# Patient Record
Sex: Female | Born: 1970 | Race: White | Hispanic: No | Marital: Married | Smoking: Never smoker
Health system: Southern US, Community
[De-identification: ages and names within clinical notes are randomized; demographics above are authoritative.]

## PROBLEM LIST (undated history)

## (undated) DIAGNOSIS — L709 Acne, unspecified: Secondary | ICD-10-CM

## (undated) DIAGNOSIS — F419 Anxiety disorder, unspecified: Secondary | ICD-10-CM

## (undated) DIAGNOSIS — F32A Depression, unspecified: Secondary | ICD-10-CM

## (undated) DIAGNOSIS — G2581 Restless legs syndrome: Secondary | ICD-10-CM

## (undated) DIAGNOSIS — C50919 Malignant neoplasm of unspecified site of unspecified female breast: Secondary | ICD-10-CM

## (undated) DIAGNOSIS — Z800 Family history of malignant neoplasm of digestive organs: Secondary | ICD-10-CM

## (undated) DIAGNOSIS — Z8052 Family history of malignant neoplasm of bladder: Secondary | ICD-10-CM

## (undated) HISTORY — PX: COLONOSCOPY: SHX174

## (undated) HISTORY — DX: Family history of malignant neoplasm of bladder: Z80.52

## (undated) HISTORY — DX: Family history of malignant neoplasm of digestive organs: Z80.0

## (undated) HISTORY — DX: Malignant neoplasm of unspecified site of unspecified female breast: C50.919

---

## 2003-01-30 ENCOUNTER — Inpatient Hospital Stay (HOSPITAL_COMMUNITY): Admission: AD | Admit: 2003-01-30 | Discharge: 2003-01-30 | Payer: Self-pay | Admitting: Obstetrics and Gynecology

## 2003-01-30 ENCOUNTER — Encounter: Payer: Self-pay | Admitting: Obstetrics and Gynecology

## 2003-03-02 ENCOUNTER — Ambulatory Visit (HOSPITAL_COMMUNITY): Admission: RE | Admit: 2003-03-02 | Discharge: 2003-03-02 | Payer: Self-pay | Admitting: Obstetrics and Gynecology

## 2003-03-02 ENCOUNTER — Encounter: Payer: Self-pay | Admitting: Obstetrics and Gynecology

## 2003-06-12 ENCOUNTER — Inpatient Hospital Stay (HOSPITAL_COMMUNITY): Admission: AD | Admit: 2003-06-12 | Discharge: 2003-06-12 | Payer: Self-pay | Admitting: Obstetrics and Gynecology

## 2003-06-14 ENCOUNTER — Inpatient Hospital Stay (HOSPITAL_COMMUNITY): Admission: AD | Admit: 2003-06-14 | Discharge: 2003-06-14 | Payer: Self-pay | Admitting: Obstetrics and Gynecology

## 2003-07-04 ENCOUNTER — Encounter: Payer: Self-pay | Admitting: Obstetrics and Gynecology

## 2003-07-04 ENCOUNTER — Inpatient Hospital Stay (HOSPITAL_COMMUNITY): Admission: AD | Admit: 2003-07-04 | Discharge: 2003-07-04 | Payer: Self-pay | Admitting: Obstetrics and Gynecology

## 2003-07-06 ENCOUNTER — Inpatient Hospital Stay (HOSPITAL_COMMUNITY): Admission: AD | Admit: 2003-07-06 | Discharge: 2003-07-06 | Payer: Self-pay | Admitting: Obstetrics and Gynecology

## 2003-07-07 ENCOUNTER — Inpatient Hospital Stay (HOSPITAL_COMMUNITY): Admission: AD | Admit: 2003-07-07 | Discharge: 2003-07-09 | Payer: Self-pay | Admitting: Obstetrics and Gynecology

## 2003-08-15 ENCOUNTER — Other Ambulatory Visit: Admission: RE | Admit: 2003-08-15 | Discharge: 2003-08-15 | Payer: Self-pay | Admitting: Obstetrics and Gynecology

## 2004-10-02 ENCOUNTER — Ambulatory Visit: Payer: Self-pay | Admitting: Internal Medicine

## 2004-12-29 ENCOUNTER — Other Ambulatory Visit: Admission: RE | Admit: 2004-12-29 | Discharge: 2004-12-29 | Payer: Self-pay | Admitting: Obstetrics and Gynecology

## 2005-05-04 ENCOUNTER — Ambulatory Visit: Payer: Self-pay | Admitting: Internal Medicine

## 2017-10-20 ENCOUNTER — Other Ambulatory Visit (HOSPITAL_COMMUNITY)
Admission: RE | Admit: 2017-10-20 | Discharge: 2017-10-20 | Disposition: A | Discharge status: Home / Self Care | Payer: BC Managed Care – PPO | Source: Ambulatory Visit | Attending: Family Medicine | Admitting: Family Medicine

## 2017-10-20 ENCOUNTER — Other Ambulatory Visit: Payer: Self-pay | Admitting: Family Medicine

## 2017-10-20 DIAGNOSIS — Z124 Encounter for screening for malignant neoplasm of cervix: Secondary | ICD-10-CM | POA: Insufficient documentation

## 2017-10-22 LAB — CYTOLOGY - PAP
Diagnosis: NEGATIVE
HPV (WINDOPATH): NOT DETECTED

## 2018-04-13 ENCOUNTER — Other Ambulatory Visit: Payer: Self-pay | Admitting: Family Medicine

## 2018-04-13 DIAGNOSIS — Z1231 Encounter for screening mammogram for malignant neoplasm of breast: Secondary | ICD-10-CM

## 2018-05-06 ENCOUNTER — Ambulatory Visit: Payer: BC Managed Care – PPO

## 2018-05-24 ENCOUNTER — Ambulatory Visit: Payer: BC Managed Care – PPO

## 2018-09-26 ENCOUNTER — Ambulatory Visit
Admission: RE | Admit: 2018-09-26 | Discharge: 2018-09-26 | Disposition: A | Discharge status: Home / Self Care | Payer: BC Managed Care – PPO | Source: Ambulatory Visit | Attending: Family Medicine | Admitting: Family Medicine

## 2018-09-26 ENCOUNTER — Ambulatory Visit: Payer: BC Managed Care – PPO

## 2018-09-26 DIAGNOSIS — Z1231 Encounter for screening mammogram for malignant neoplasm of breast: Secondary | ICD-10-CM

## 2018-09-26 IMAGING — MG DIGITAL SCREENING BILATERAL MAMMOGRAM WITH TOMO AND CAD
8 series · 8 of 24 positions shown · non-contrast
Comparison: Previous exam(s).

CLINICAL DATA: Screening.

EXAM:
DIGITAL SCREENING BILATERAL MAMMOGRAM WITH TOMO AND CAD

[L CC synth-2D]
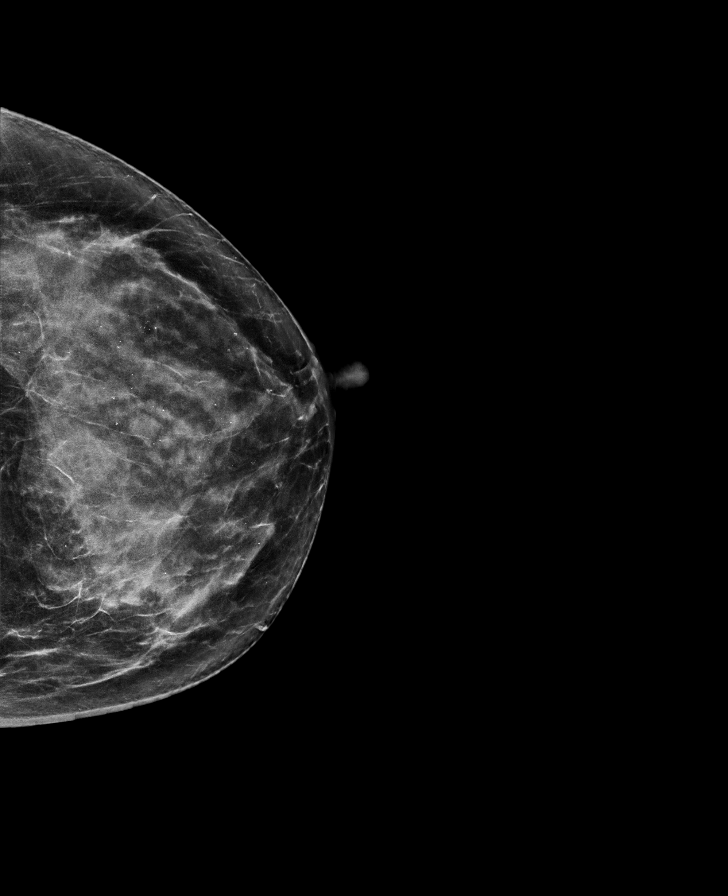

[R MLO synth-2D]
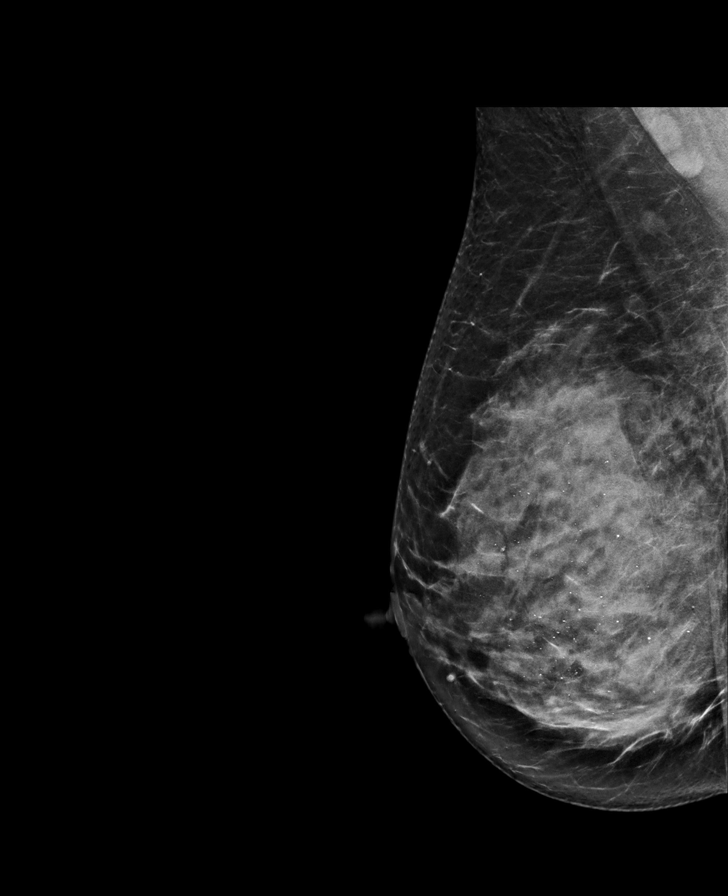

[L MLO synth-2D]
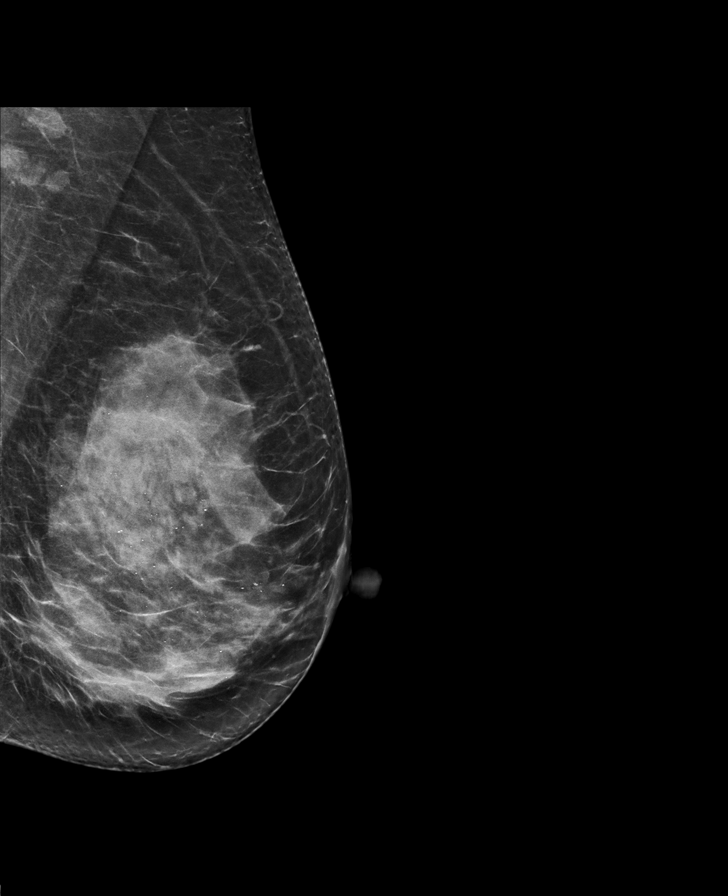

[R CC synth-2D]
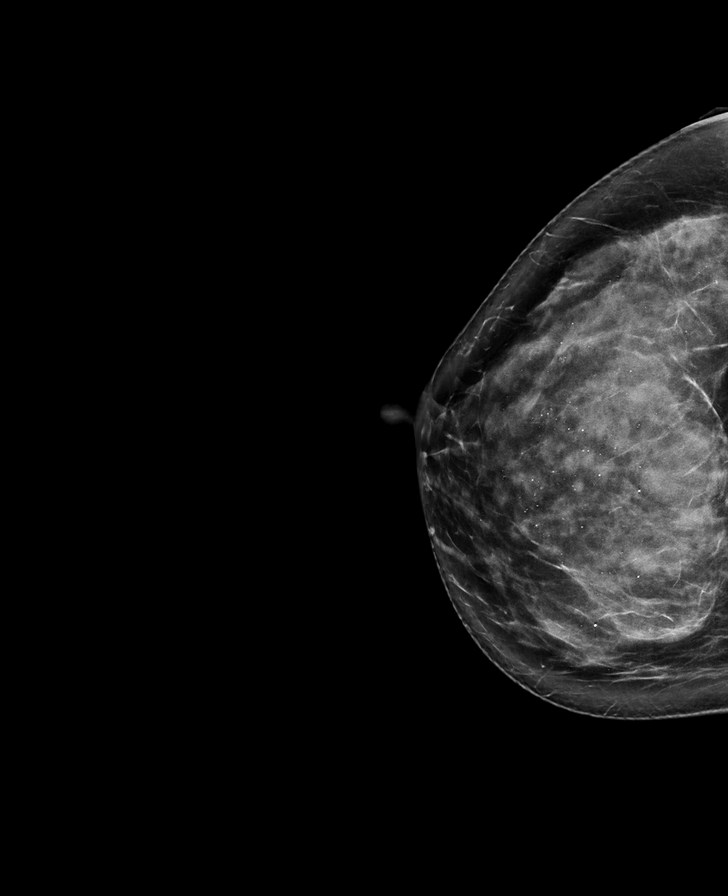

[L CC tomo · tomo slice 36/71.0]
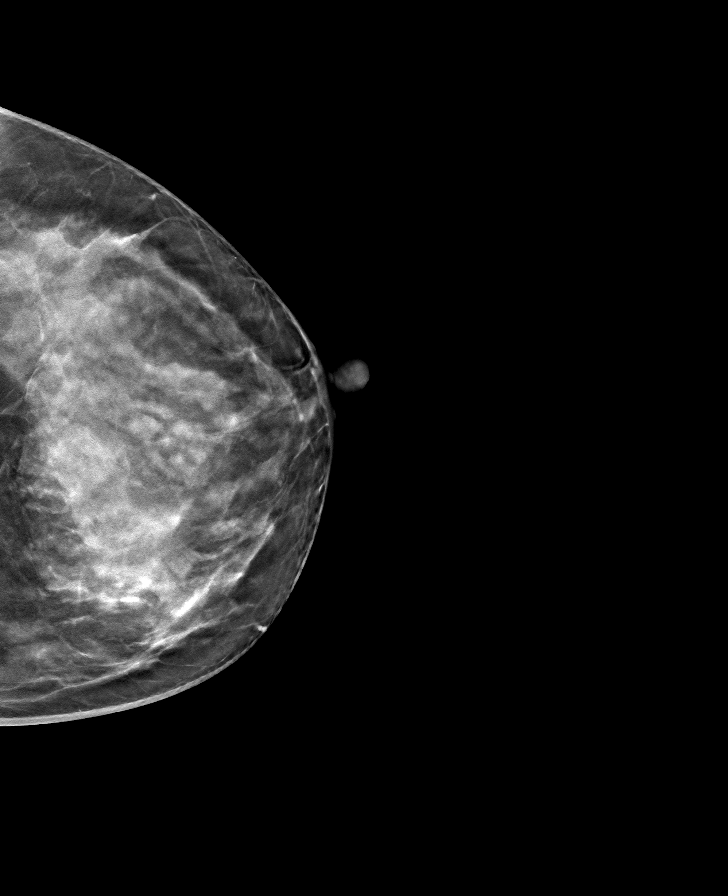

[R MLO tomo · tomo slice 39/76.0]
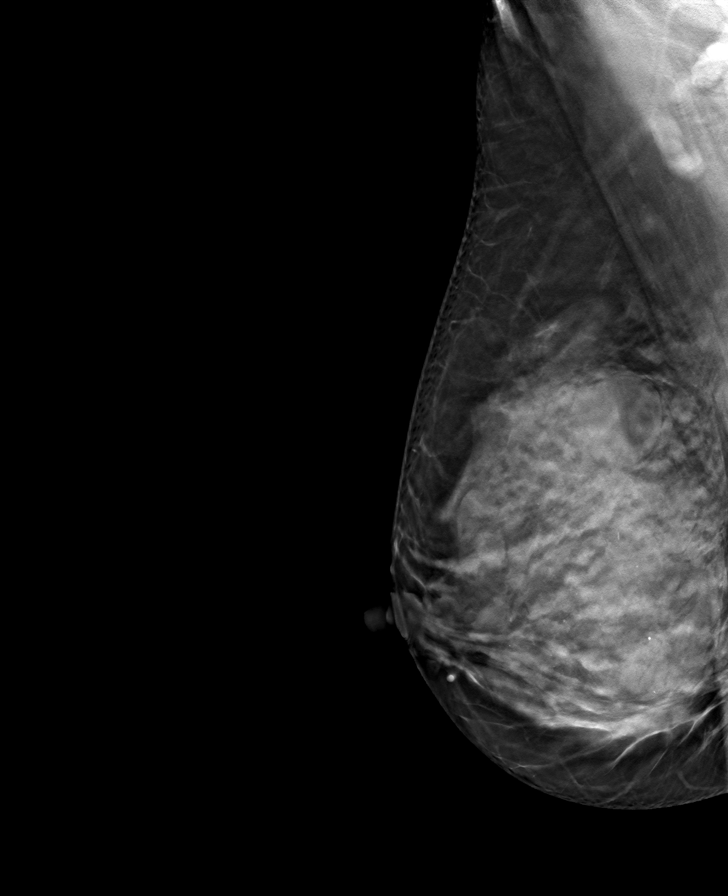

[R CC tomo · tomo slice 38/75.0]
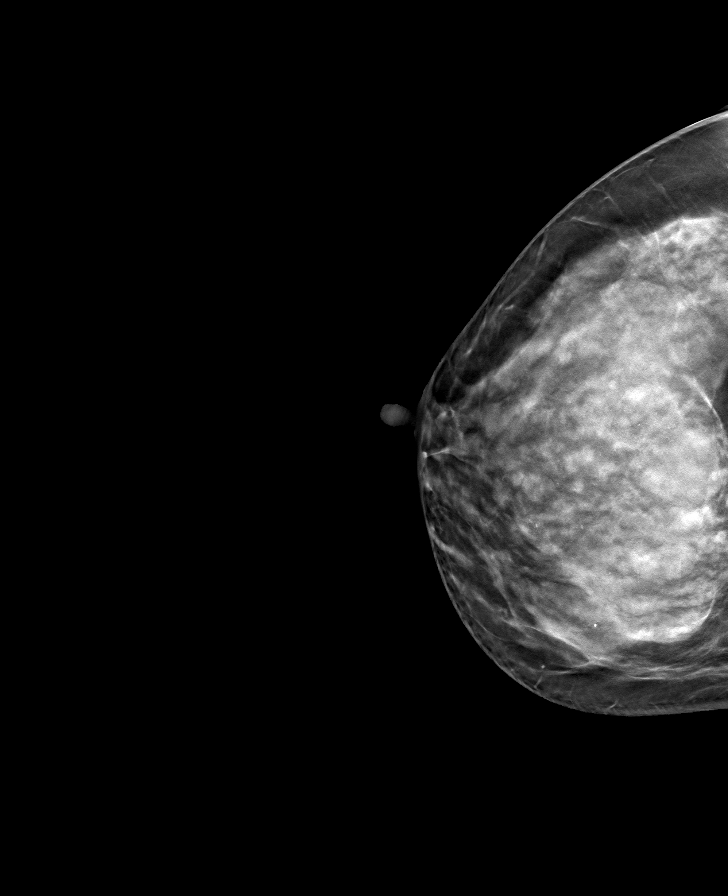

[L MLO tomo · tomo slice 37/73.0]
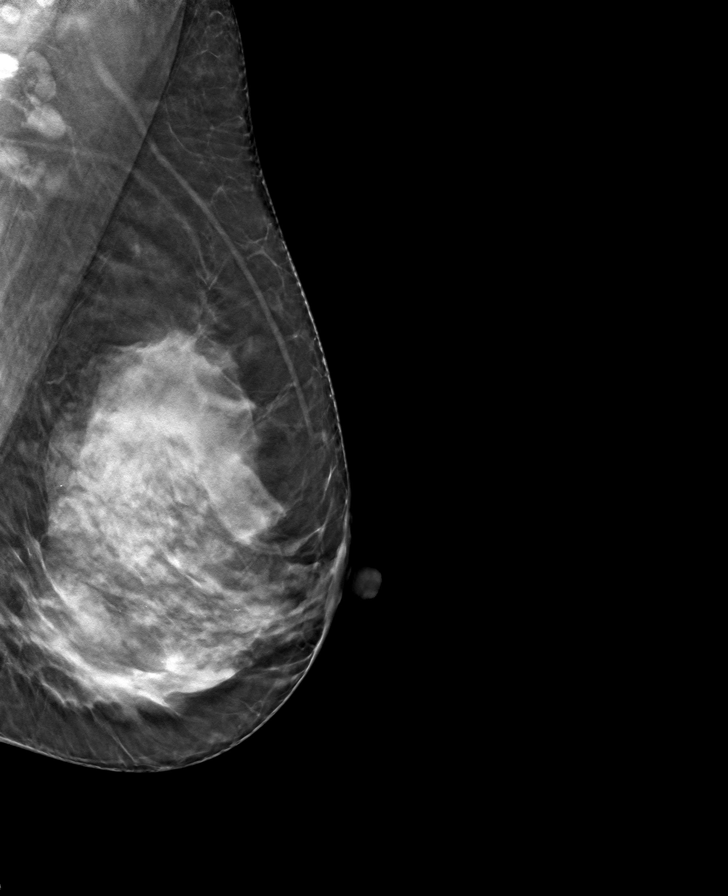

[8 of 24 positions shown; findings below may reference images not displayed]

ACR Breast Density Category d: The breast tissue is extremely dense,
which lowers the sensitivity of mammography
FINDINGS: There are no findings suspicious for malignancy. Images were
processed with CAD.
IMPRESSION: No mammographic evidence of malignancy. A result letter of this
screening mammogram will be mailed directly to the patient.

RECOMMENDATION:
Screening mammogram in one year. (Code:[5I])

BI-RADS CATEGORY  1: Negative.

## 2020-01-31 ENCOUNTER — Other Ambulatory Visit: Payer: Self-pay | Admitting: Family Medicine

## 2020-01-31 DIAGNOSIS — Z1231 Encounter for screening mammogram for malignant neoplasm of breast: Secondary | ICD-10-CM

## 2020-02-23 ENCOUNTER — Other Ambulatory Visit: Payer: Self-pay

## 2020-02-23 ENCOUNTER — Ambulatory Visit
Admission: RE | Admit: 2020-02-23 | Discharge: 2020-02-23 | Disposition: A | Discharge status: Home / Self Care | Payer: BC Managed Care – PPO | Source: Ambulatory Visit | Attending: Family Medicine | Admitting: Family Medicine

## 2020-02-23 ENCOUNTER — Ambulatory Visit: Payer: BC Managed Care – PPO

## 2020-02-23 DIAGNOSIS — Z1231 Encounter for screening mammogram for malignant neoplasm of breast: Secondary | ICD-10-CM

## 2020-02-23 IMAGING — MG DIGITAL SCREENING BILAT W/ TOMO W/ CAD
6 of 10 series · 6 of 30 positions shown · non-contrast
Comparison: Previous exam(s).

CLINICAL DATA: Screening.

EXAM:
DIGITAL SCREENING BILATERAL MAMMOGRAM WITH TOMO AND CAD

[R CC synth-2D]
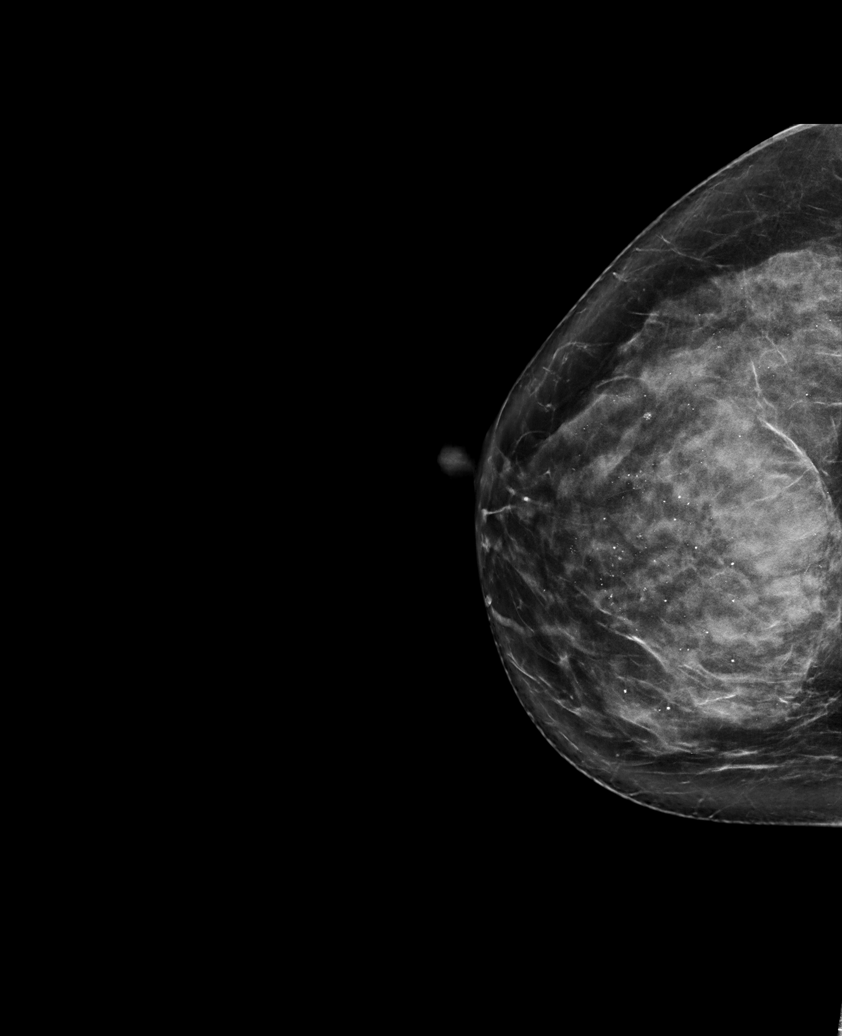

[R MLO synth-2D]
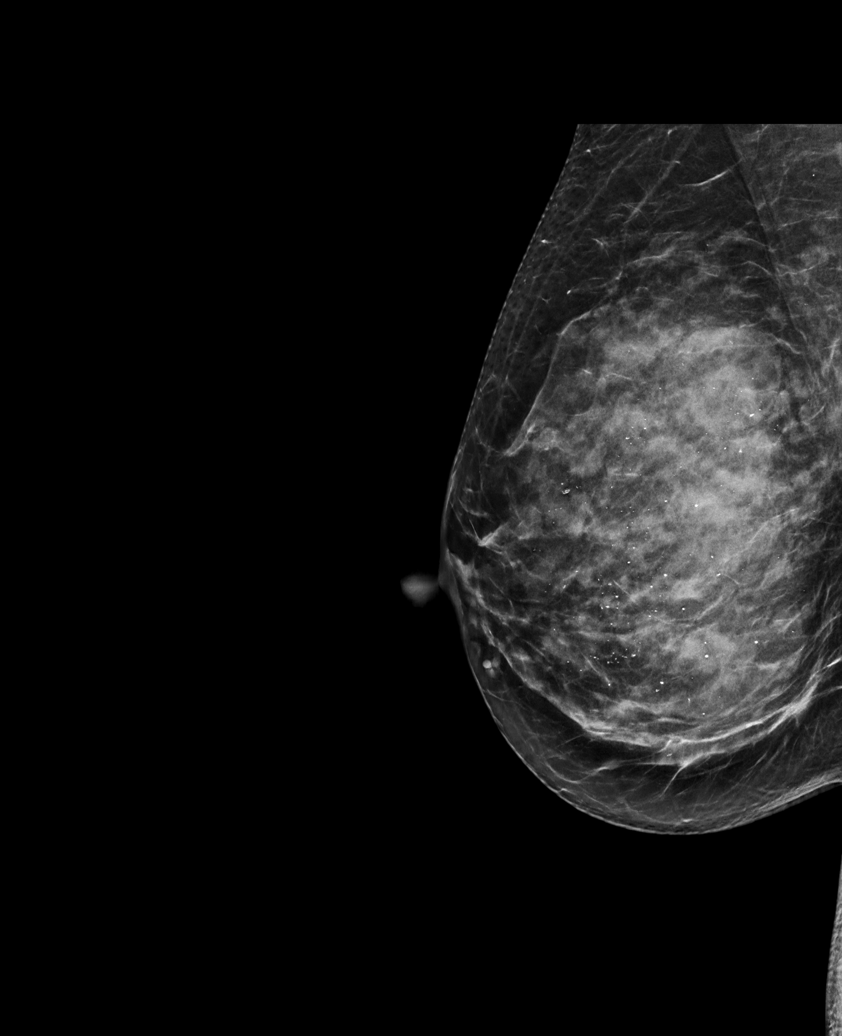

[L MLO synth-2D (1 of 2)]
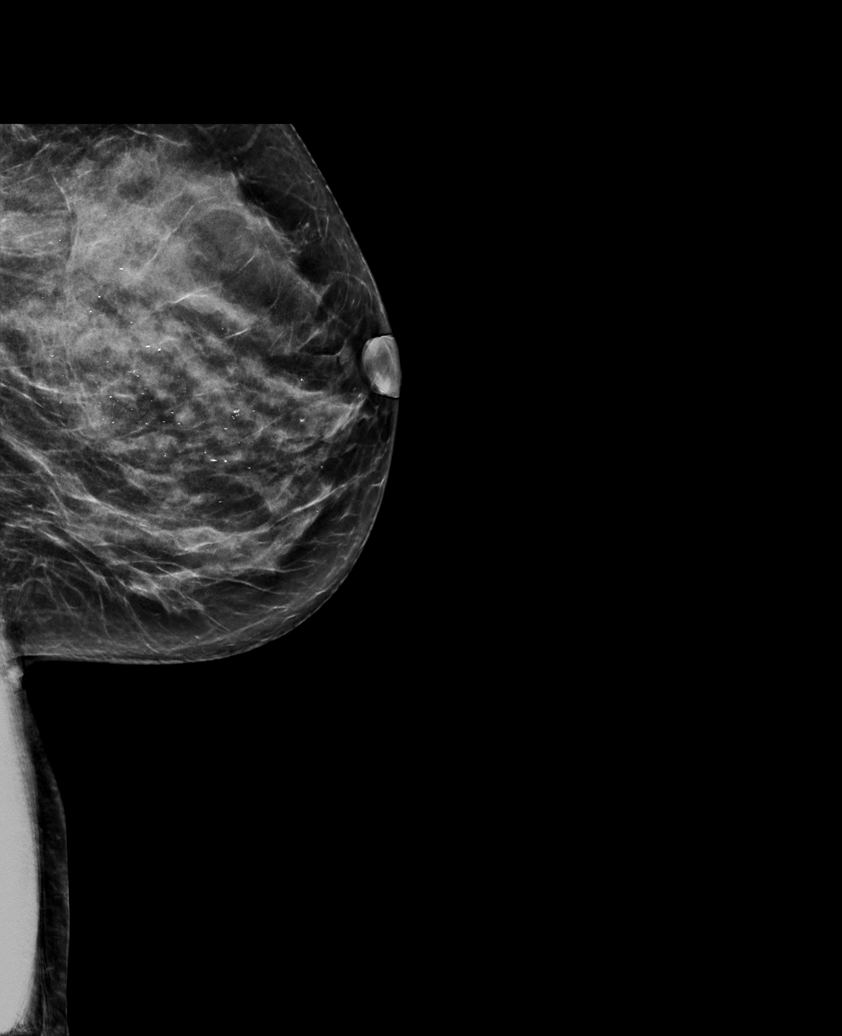

[L MLO synth-2D (2 of 2)]
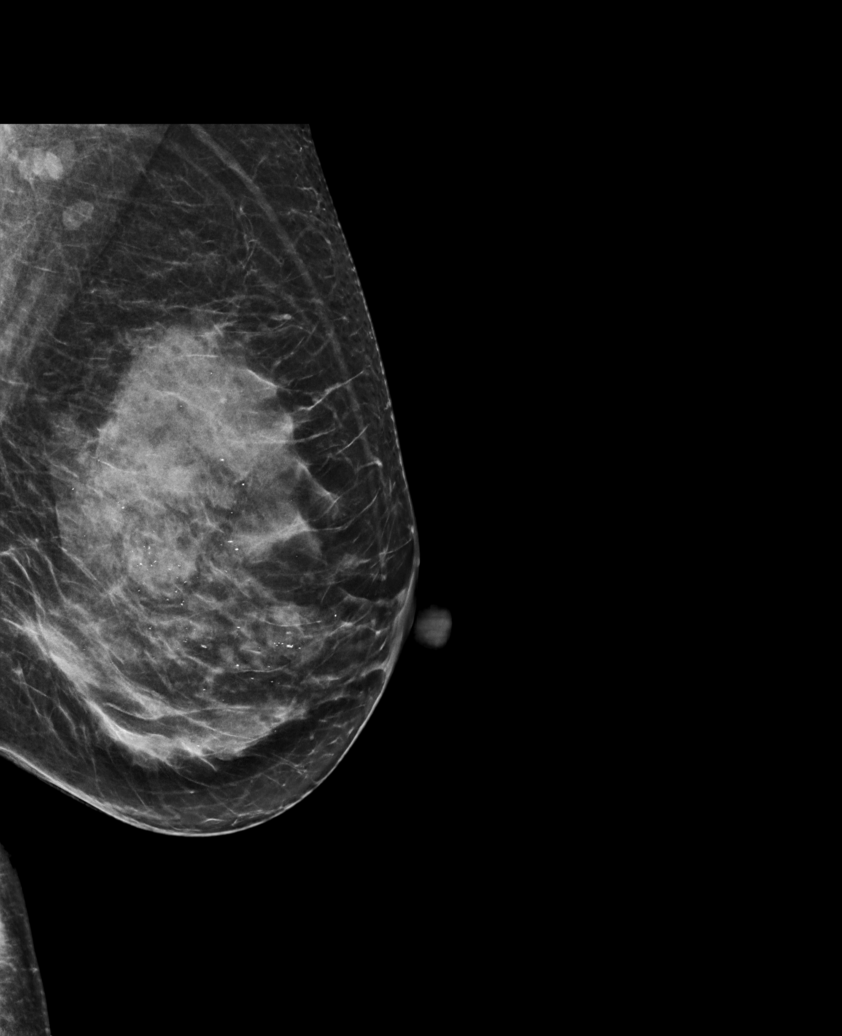

[L CC synth-2D]
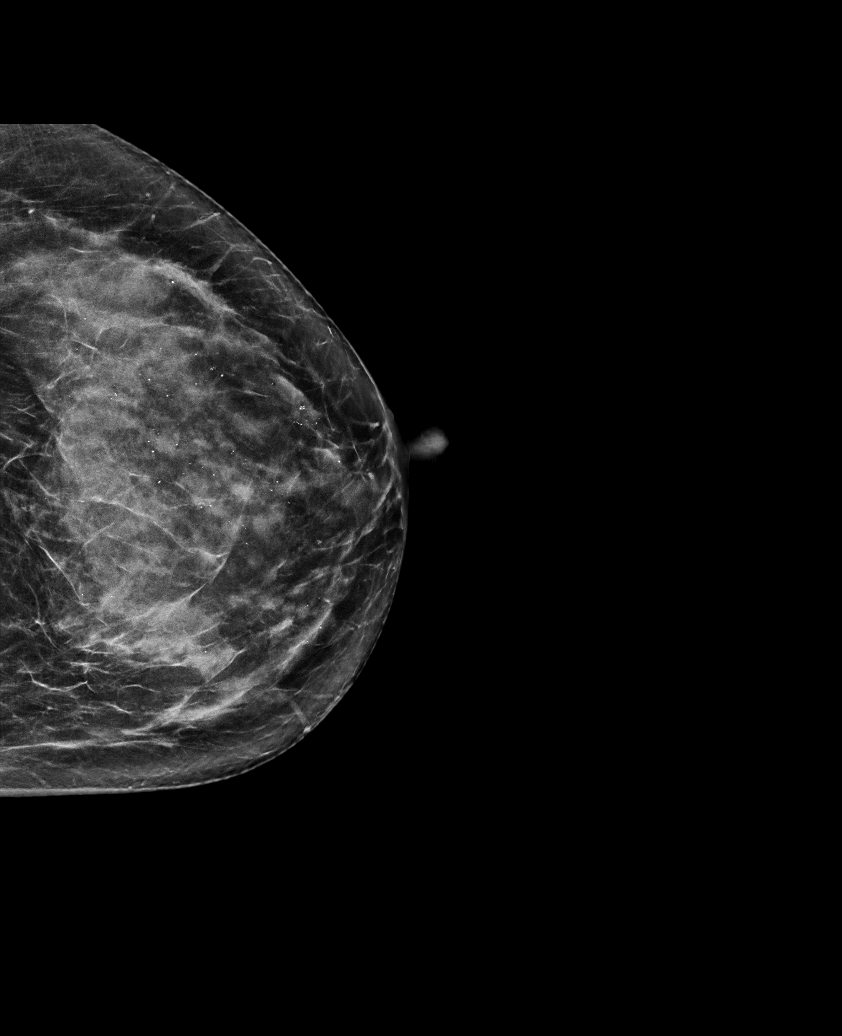

[R CC tomo · tomo slice 37/73.0]
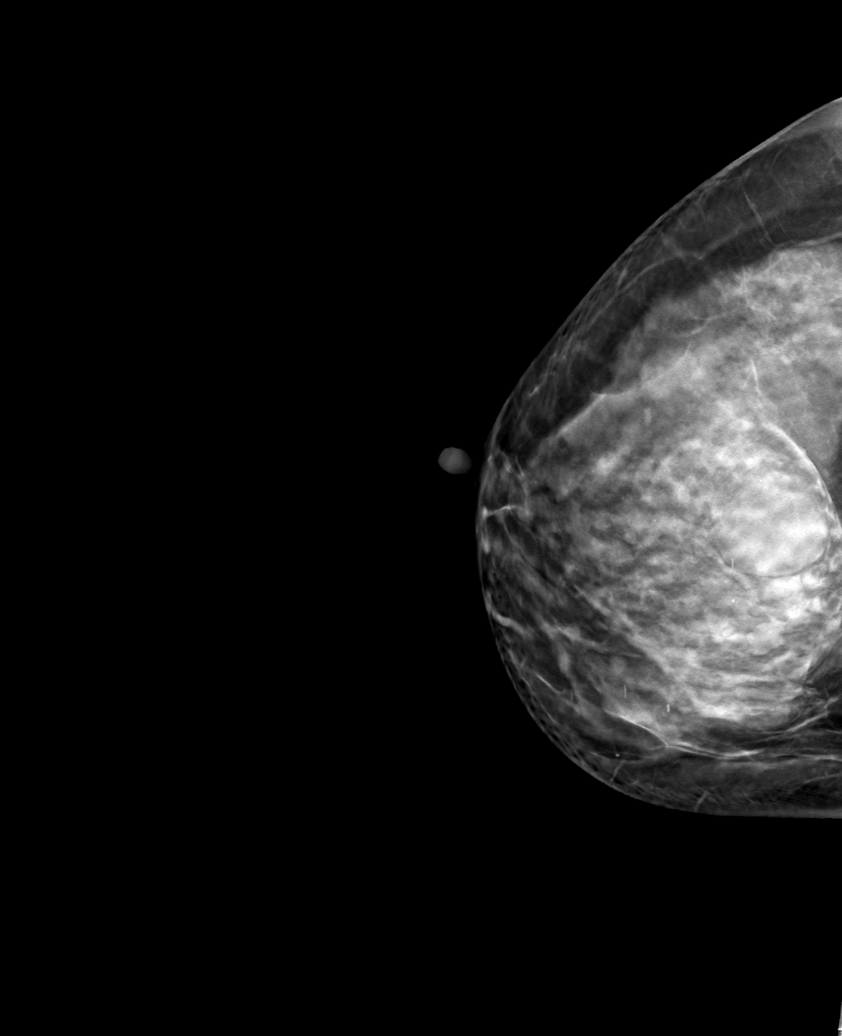

[6 of 30 positions shown; findings below may reference images not displayed]

ACR Breast Density Category d: The breast tissue is extremely dense,
which lowers the sensitivity of mammography
FINDINGS: There are no findings suspicious for malignancy. Images were
processed with CAD.
IMPRESSION: No mammographic evidence of malignancy. A result letter of this
screening mammogram will be mailed directly to the patient.

RECOMMENDATION:
Screening mammogram in one year. (Code:[5I])

BI-RADS CATEGORY  1: Negative.

## 2021-01-15 ENCOUNTER — Other Ambulatory Visit: Payer: Self-pay | Admitting: Family Medicine

## 2021-01-15 DIAGNOSIS — Z1231 Encounter for screening mammogram for malignant neoplasm of breast: Secondary | ICD-10-CM

## 2021-03-06 ENCOUNTER — Ambulatory Visit: Payer: BC Managed Care – PPO

## 2021-04-22 ENCOUNTER — Ambulatory Visit
Admission: RE | Admit: 2021-04-22 | Discharge: 2021-04-22 | Disposition: A | Discharge status: Home / Self Care | Payer: Self-pay | Source: Ambulatory Visit | Attending: Family Medicine | Admitting: Family Medicine

## 2021-04-22 ENCOUNTER — Other Ambulatory Visit: Payer: Self-pay

## 2021-04-22 DIAGNOSIS — Z1231 Encounter for screening mammogram for malignant neoplasm of breast: Secondary | ICD-10-CM

## 2021-04-22 IMAGING — MG MM DIGITAL SCREENING BILAT W/ TOMO AND CAD
8 series · 8 of 24 positions shown · non-contrast
Comparison: Previous exam(s).

CLINICAL DATA: Screening.

EXAM:
DIGITAL SCREENING BILATERAL MAMMOGRAM WITH TOMOSYNTHESIS AND CAD
TECHNIQUE: Bilateral screening digital craniocaudal and mediolateral oblique
mammograms were obtained. Bilateral screening digital breast
tomosynthesis was performed. The images were evaluated with
computer-aided detection.

[L CC synth-2D]
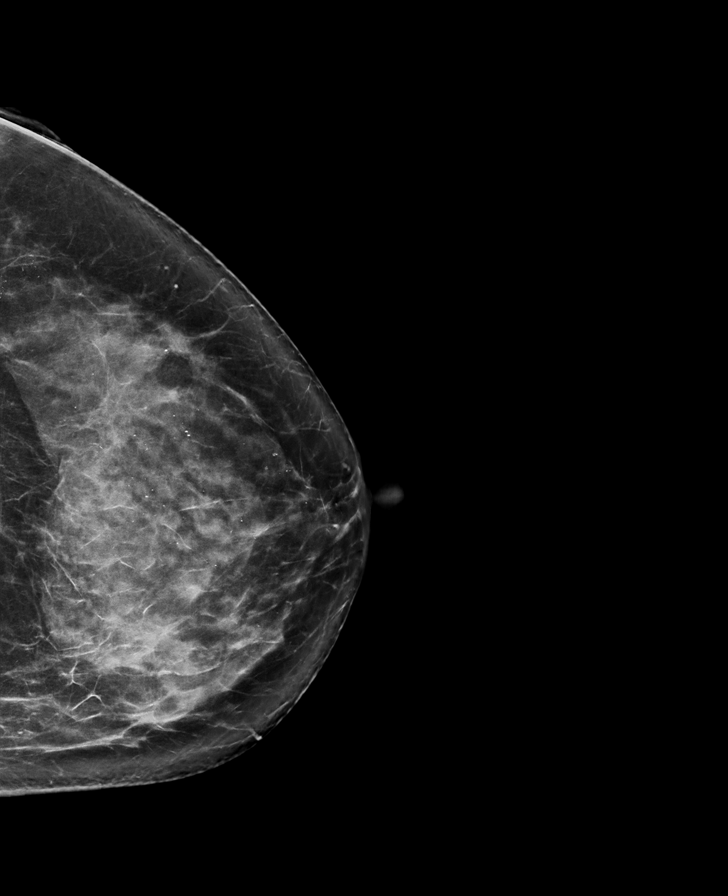

[R CC synth-2D]
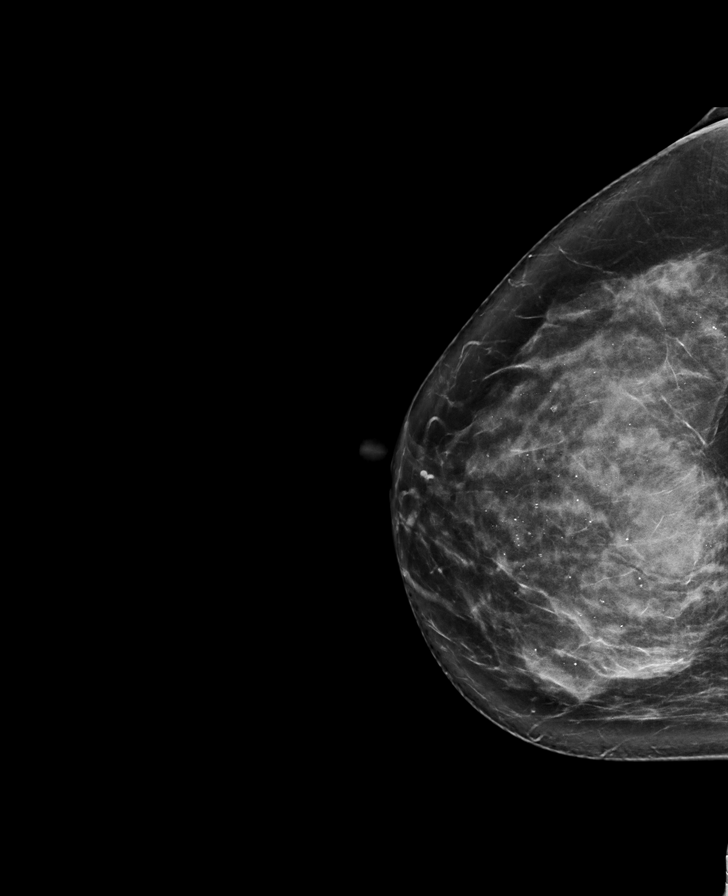

[R MLO synth-2D]
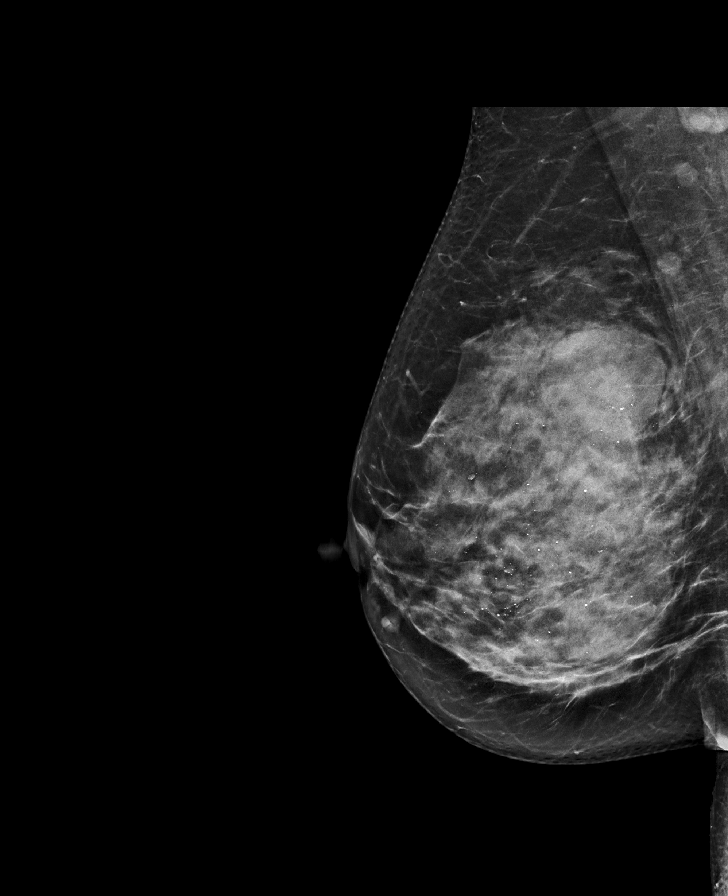

[L MLO synth-2D]
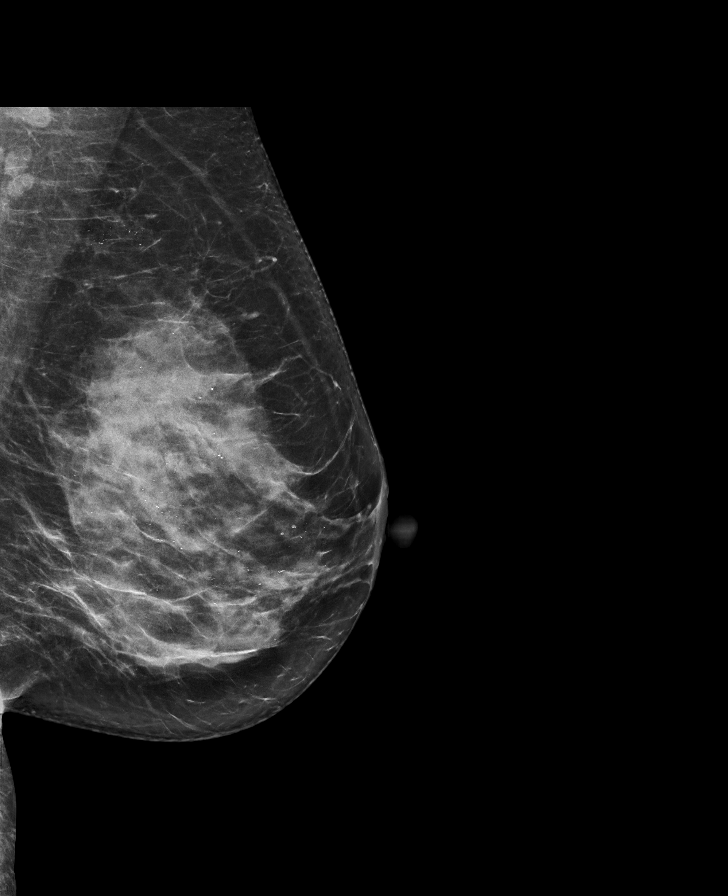

[R CC tomo · tomo slice 41/80.0]
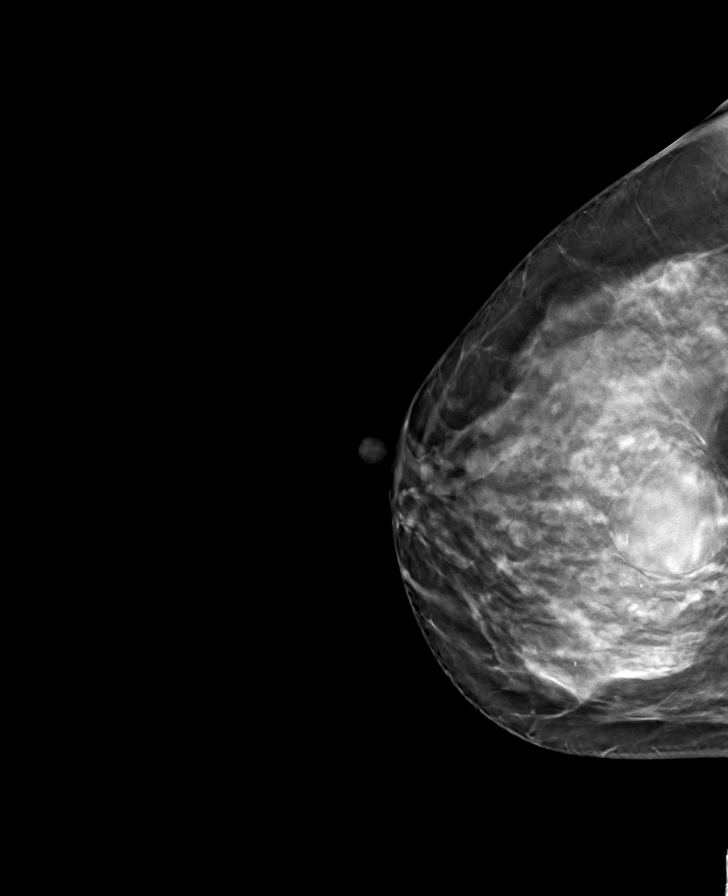

[R MLO tomo · tomo slice 41/81.0]
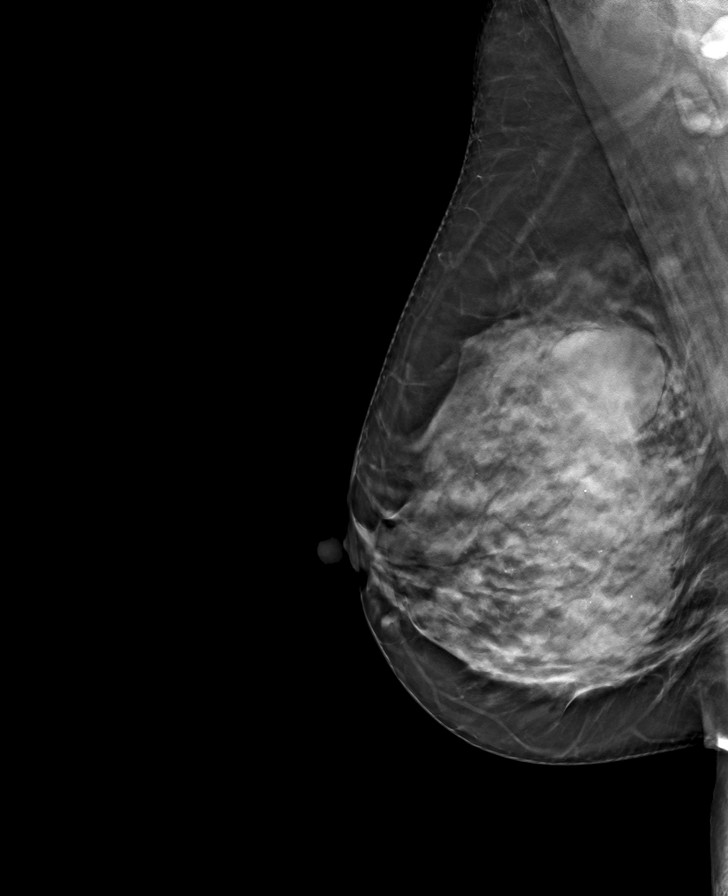

[L MLO tomo · tomo slice 41/80.0]
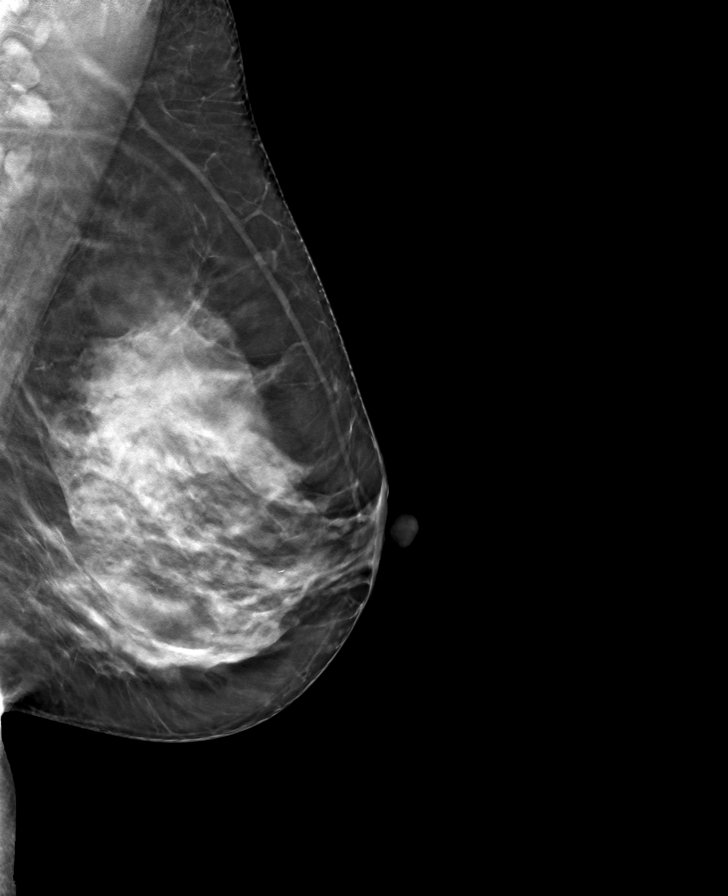

[L CC tomo · tomo slice 41/82.0]
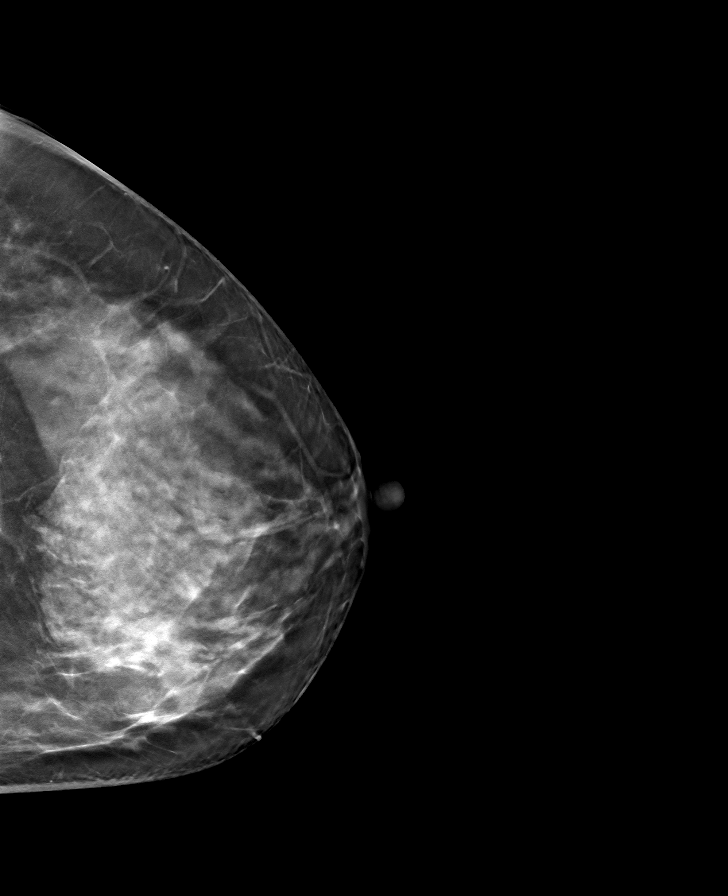

[8 of 24 positions shown; findings below may reference images not displayed]

ACR Breast Density Category d: The breast tissue is extremely dense,
which lowers the sensitivity of mammography.
FINDINGS: In the left breast, calcifications warrant further evaluation. In
the right breast, no findings suspicious for malignancy.
IMPRESSION: Further evaluation is suggested for calcifications in the left
breast.

RECOMMENDATION:
Diagnostic mammogram of the left breast. (Code:[R4])

The patient will be contacted regarding the findings, and additional
imaging will be scheduled.

BI-RADS CATEGORY  0: Incomplete. Need additional imaging evaluation
and/or prior mammograms for comparison.

## 2021-04-25 ENCOUNTER — Other Ambulatory Visit: Payer: Self-pay | Admitting: Family Medicine

## 2021-04-25 DIAGNOSIS — R928 Other abnormal and inconclusive findings on diagnostic imaging of breast: Secondary | ICD-10-CM

## 2021-05-09 ENCOUNTER — Ambulatory Visit
Admission: RE | Admit: 2021-05-09 | Discharge: 2021-05-09 | Disposition: A | Discharge status: Home / Self Care | Payer: BC Managed Care – PPO | Source: Ambulatory Visit | Attending: Family Medicine | Admitting: Family Medicine

## 2021-05-09 ENCOUNTER — Other Ambulatory Visit: Payer: Self-pay | Admitting: Family Medicine

## 2021-05-09 ENCOUNTER — Other Ambulatory Visit: Payer: Self-pay

## 2021-05-09 DIAGNOSIS — R928 Other abnormal and inconclusive findings on diagnostic imaging of breast: Secondary | ICD-10-CM

## 2021-05-09 DIAGNOSIS — R921 Mammographic calcification found on diagnostic imaging of breast: Secondary | ICD-10-CM

## 2021-05-09 IMAGING — MG DIGITAL DIAGNOSTIC UNILAT LEFT W/ CAD
5 series · 5 of 5 positions shown · non-contrast
Comparison: Previous exam(s).

CLINICAL DATA: 49-year-old female recalled from screening mammogram
dated [DATE] for left breast calcifications.

EXAM:
DIGITAL DIAGNOSTIC UNILATERAL LEFT MAMMOGRAM WITH CAD
TECHNIQUE: Left digital diagnostic mammography was performed. Mammographic
images were processed with CAD.

[L ML (1 of 2)]
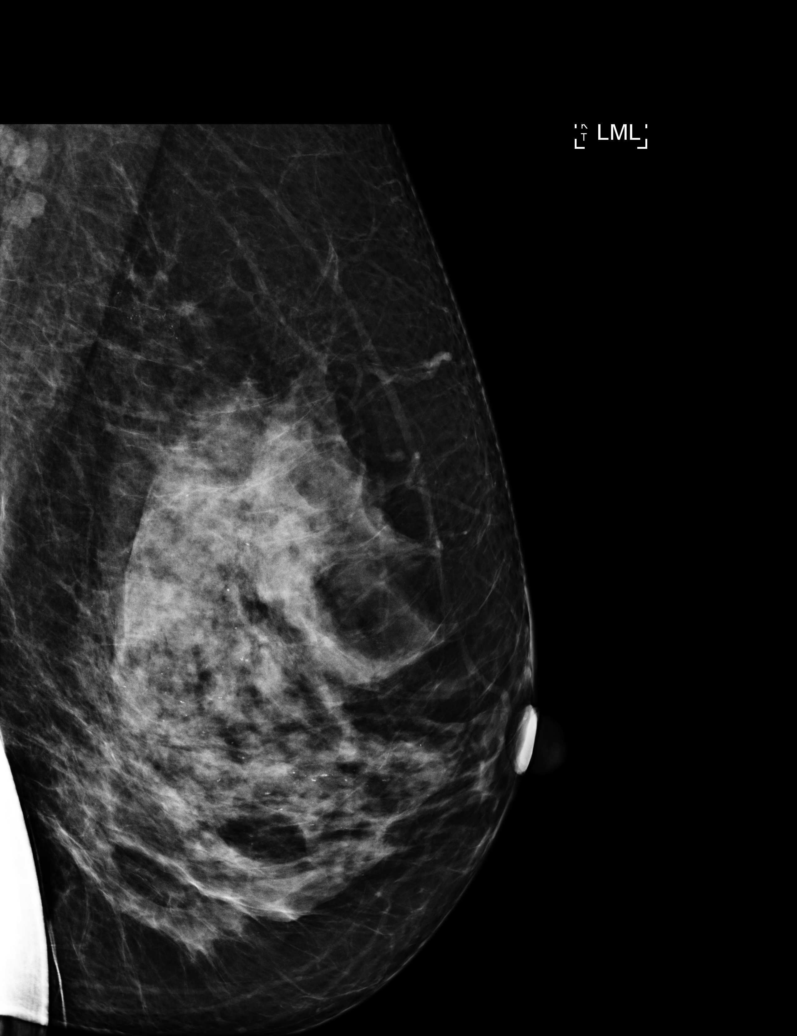

[L ML (2 of 2)]
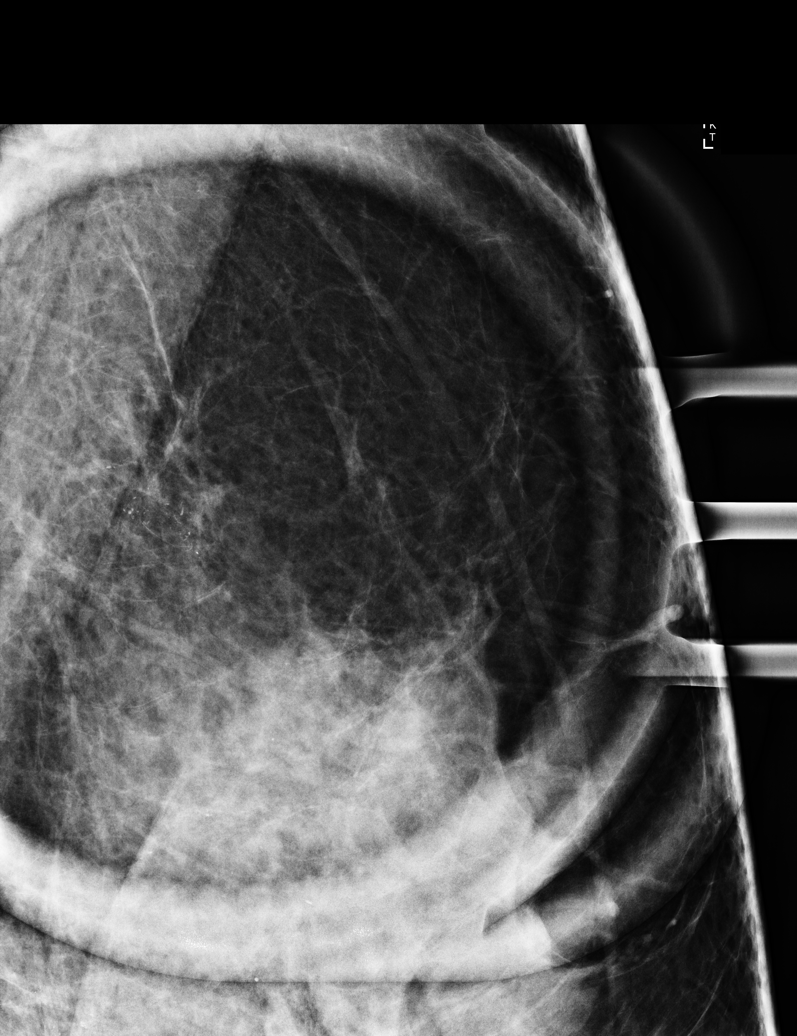

[L CC (1 of 3)]
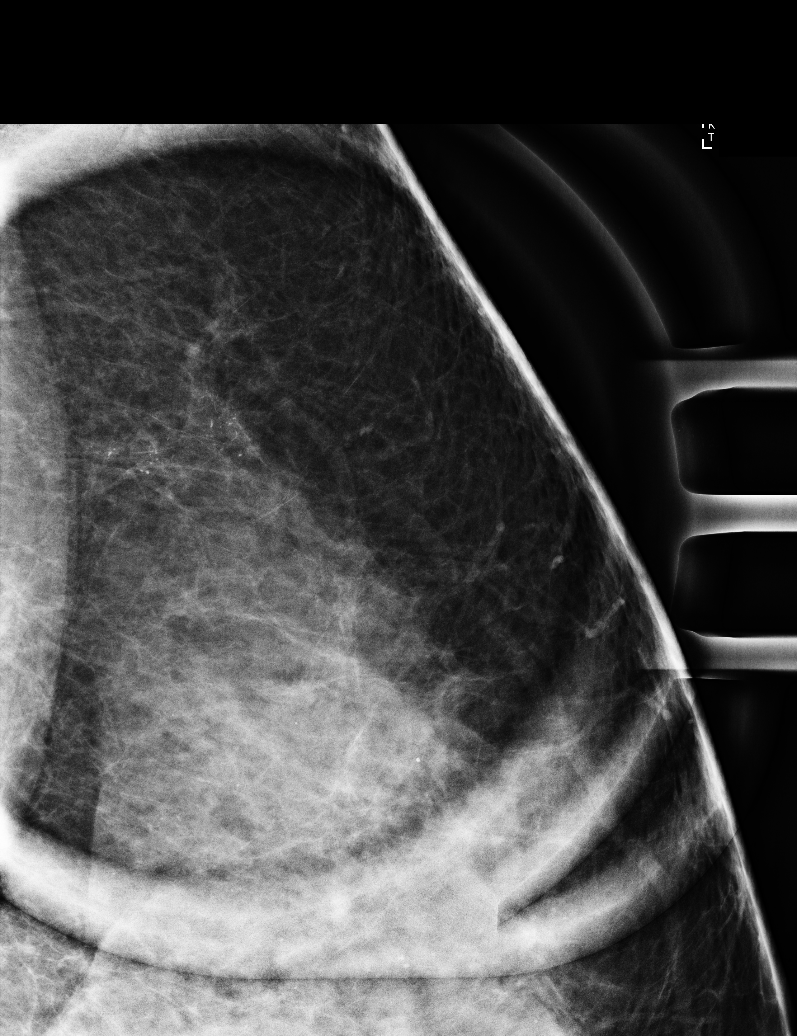

[L CC (2 of 3)]
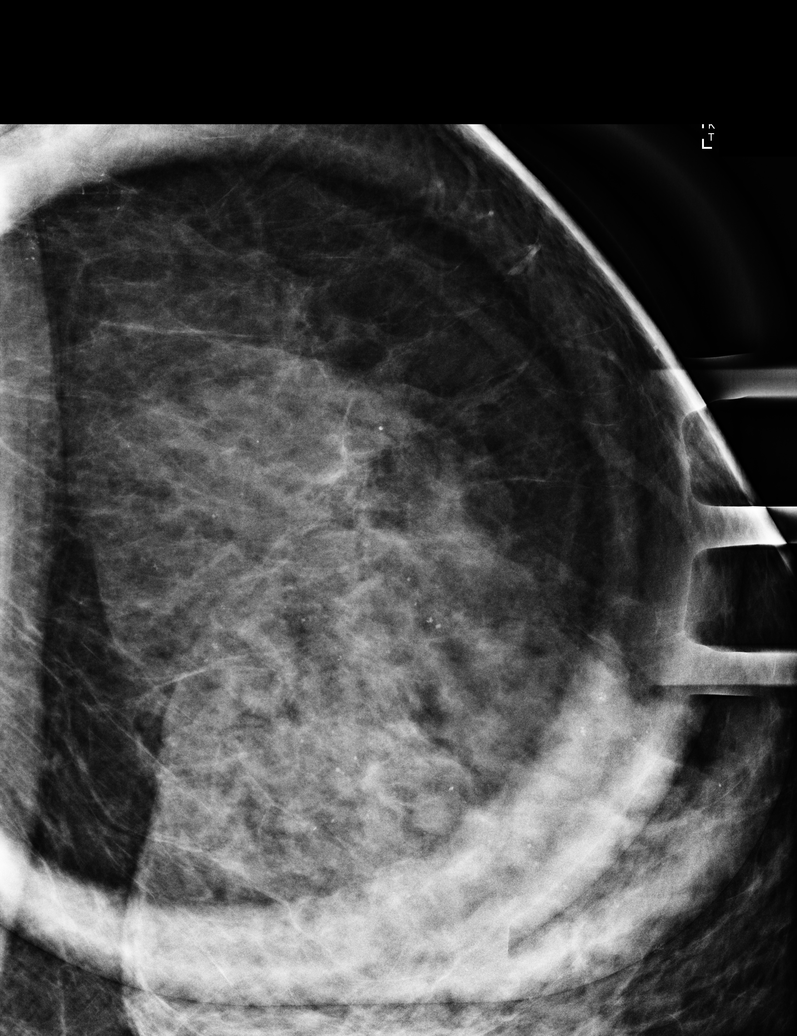

[L CC (3 of 3)]
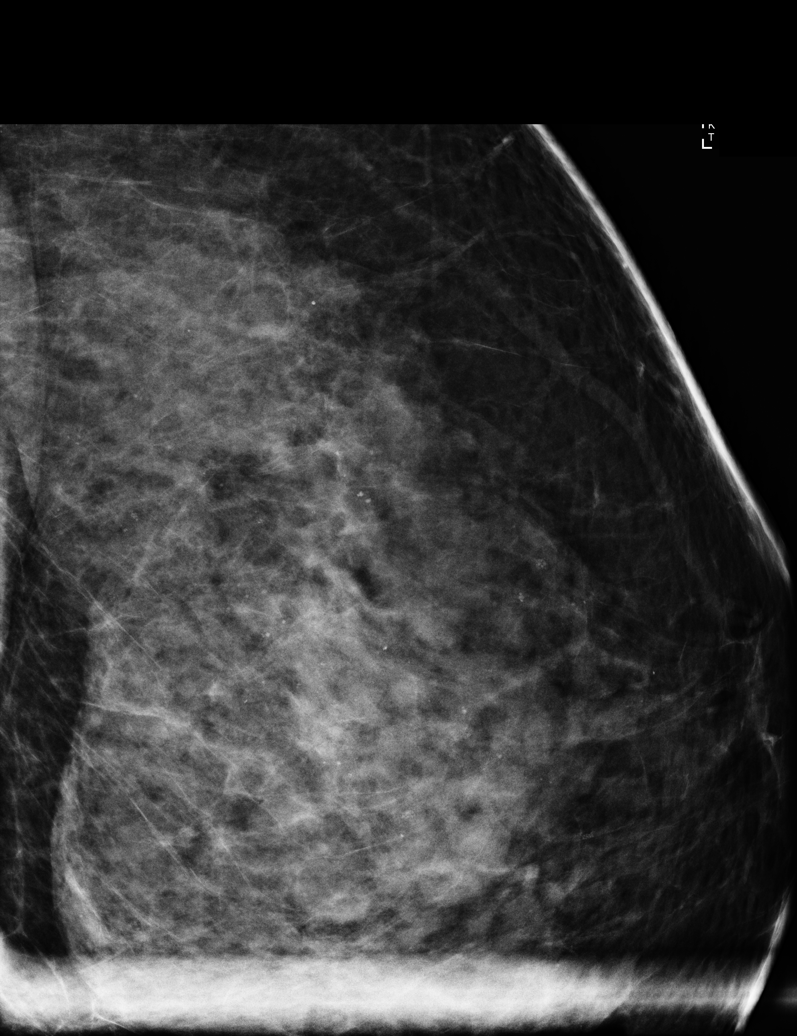

[5 of 5 positions shown; findings below may reference images not displayed]

ACR Breast Density Category d: The breast tissue is extremely dense,
which lowers the sensitivity of mammography.
FINDINGS: Grouped calcifications in the far upper outer quadrant of the left
breast demonstrate a pleomorphic morphology. They span 1.8 x 1.8 x 1
cm. These have an indeterminate morphology.
IMPRESSION: Indeterminate left breast calcifications spanning up to 1.8 cm.
Recommendation is for stereotactic biopsy.

RECOMMENDATION:
Stereotactic biopsy of the left breast.

I have discussed the findings and recommendations with the patient.
If applicable, a reminder letter will be sent to the patient
regarding the next appointment.

BI-RADS CATEGORY  4: Suspicious.

## 2021-05-16 ENCOUNTER — Ambulatory Visit
Admission: RE | Admit: 2021-05-16 | Discharge: 2021-05-16 | Disposition: A | Discharge status: Home / Self Care | Payer: BC Managed Care – PPO | Source: Ambulatory Visit | Attending: Family Medicine | Admitting: Family Medicine

## 2021-05-16 ENCOUNTER — Other Ambulatory Visit: Payer: Self-pay

## 2021-05-16 ENCOUNTER — Other Ambulatory Visit: Payer: Self-pay | Admitting: Family Medicine

## 2021-05-16 DIAGNOSIS — R921 Mammographic calcification found on diagnostic imaging of breast: Secondary | ICD-10-CM

## 2021-05-16 IMAGING — MG MM BREAST BX W LOC DEV 1ST LESION IMAGE BX SPEC STEREO GUIDE*L*
8 of 9 series · 8 of 17 positions shown · non-contrast
Comparison: Previous exams.
COMPARISON: Previous exams.

Addendum:
CLINICAL DATA: 49-year-old female with indeterminate left breast
calcifications.

EXAM:
LEFT BREAST STEREOTACTIC CORE NEEDLE BIOPSY

[L (1 of 6)]
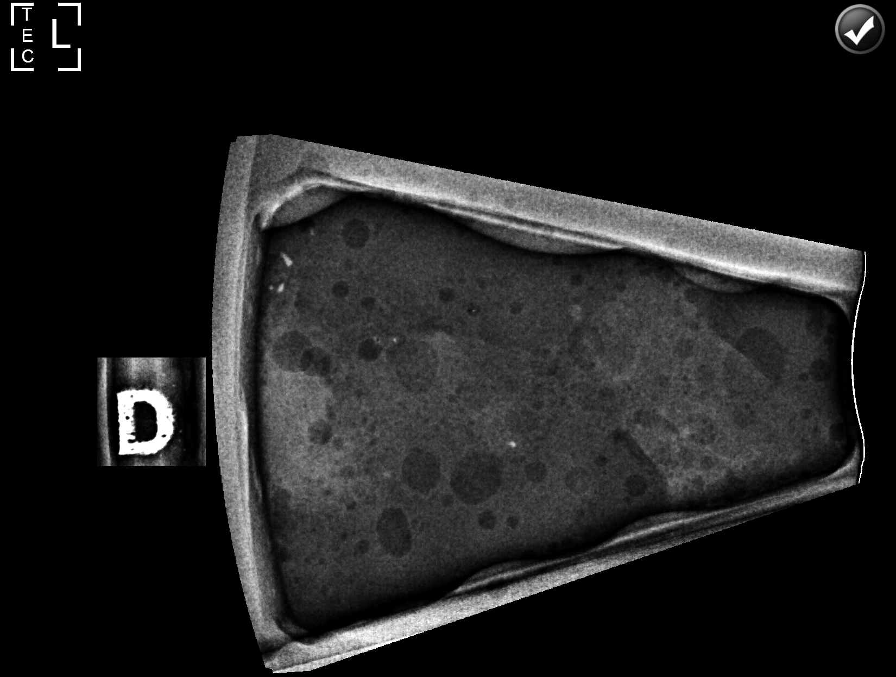

[L (2 of 6)]
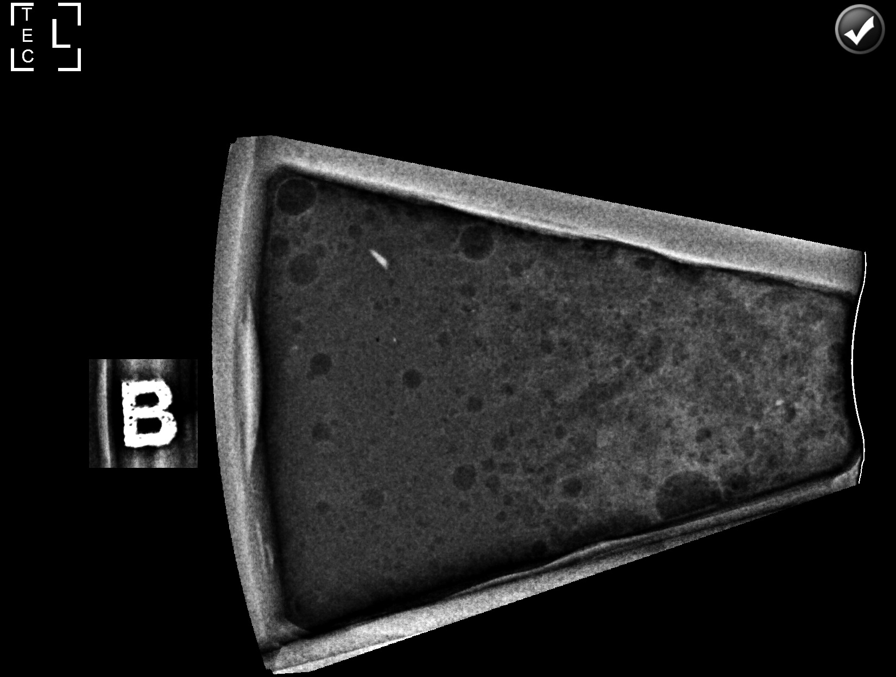

[L (3 of 6)]
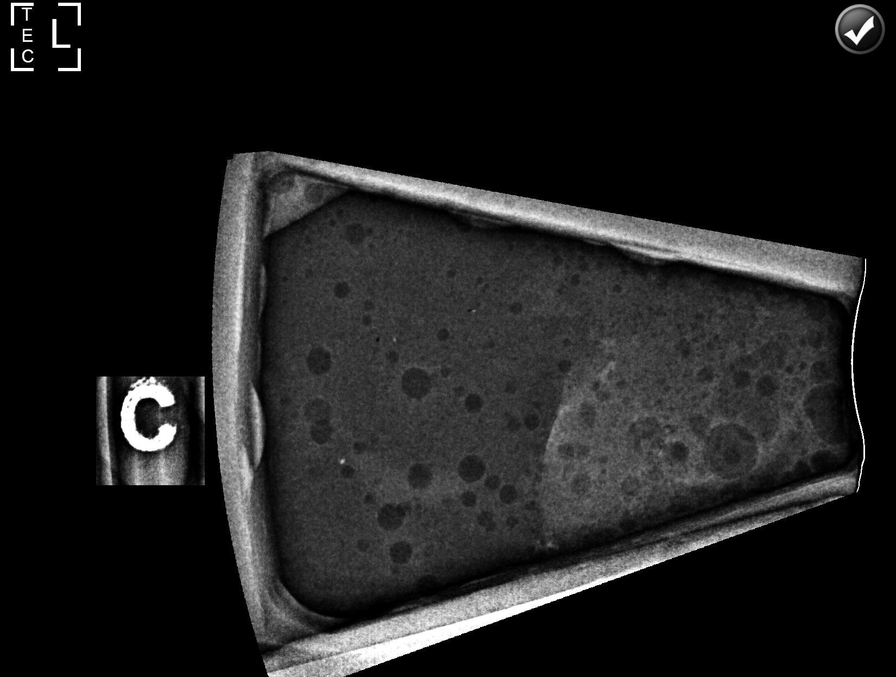

[L (4 of 6)]
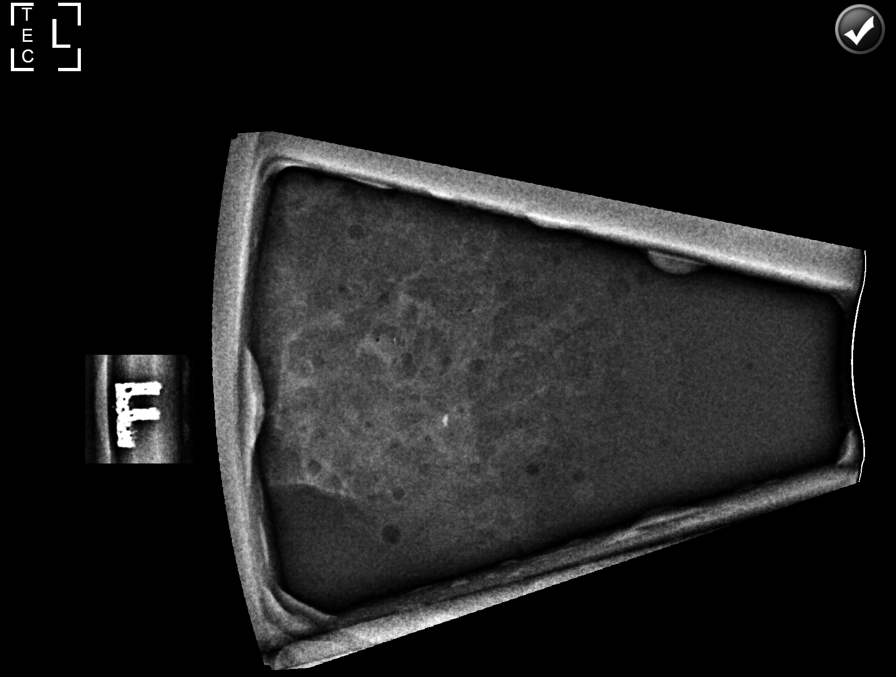

[L (5 of 6)]
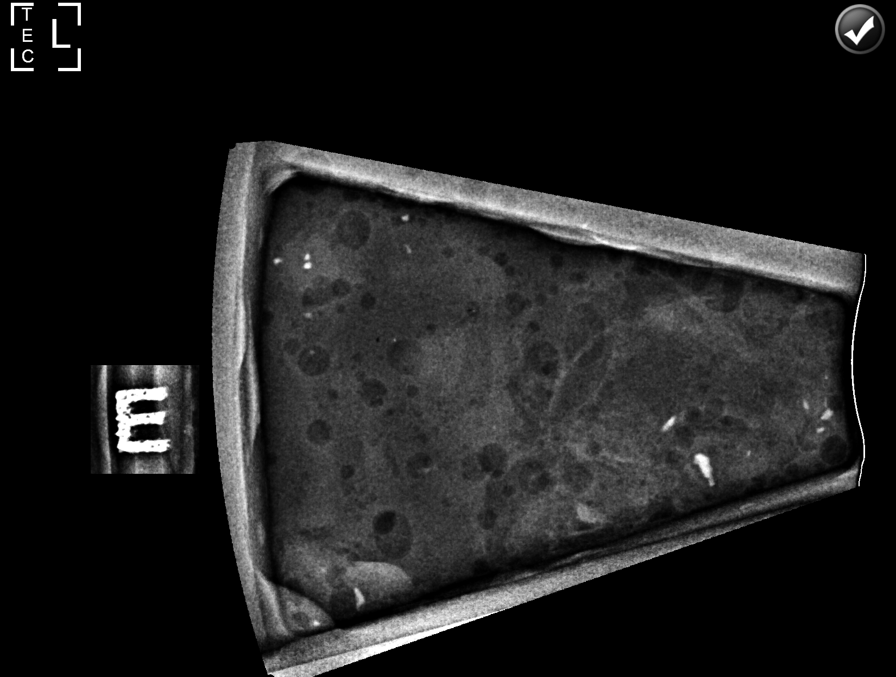

[L (6 of 6)]
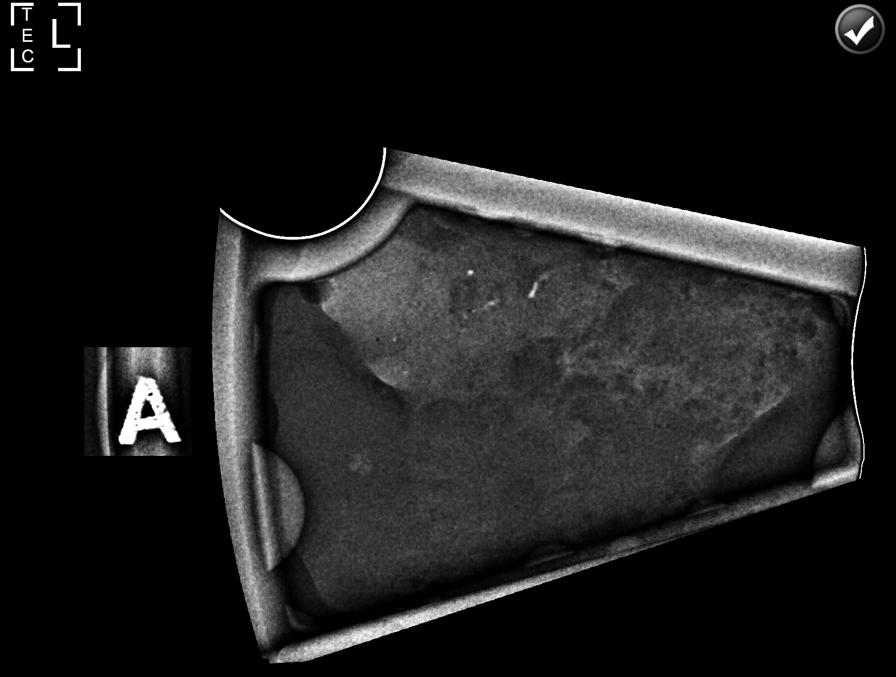

[L LM]
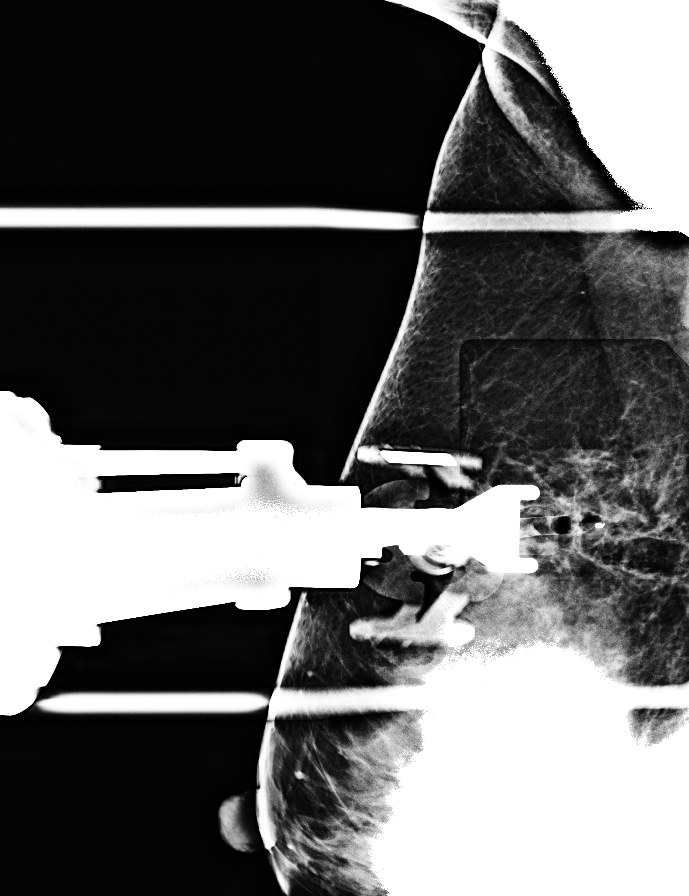

[L LM tomo · tomo slice 35/70.0]
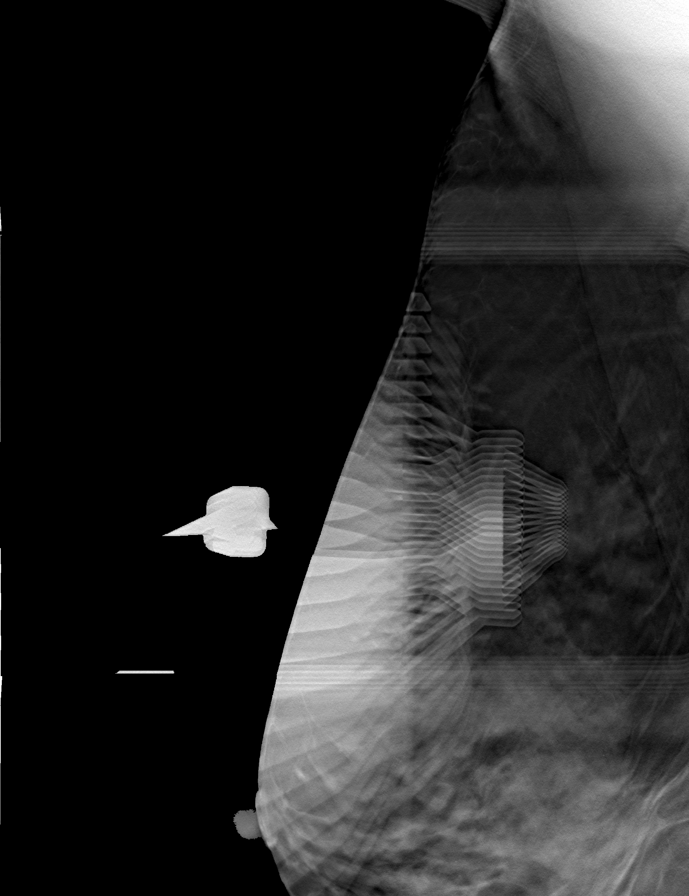

[8 of 17 positions shown; findings below may reference images not displayed]



Using sterile technique and 1% Lidocaine as local anesthetic, under
stereotactic guidance, a 9 gauge vacuum assisted device was used to
perform core needle biopsy of calcifications in the far posterior
upper outer quadrant using a lateral approach. Specimen radiograph
was performed showing calcifications within several specimens.
Specimens with calcifications are identified for pathology.

Lesion quadrant: Upper outer quadrant

At the conclusion of the procedure, a coil shaped tissue marker clip
was deployed into the biopsy cavity. Follow-up 2-view mammogram was
performed and dictated separately.
IMPRESSION: Stereotactic-guided biopsy of the left breast. No apparent
complications.

ADDENDUM:
Pathology revealed HIGH-GRADE DUCTAL CARCINOMA IN SITU WITH NECROSIS
AND CALCIFICATIONS, FOCALLY SUSPICIOUS FOR MICROINVASION of the LEFT
breast, upper outer quadrant, posterior, (coil clip). This was found
to be concordant by Dr. VIV.

Pathology results were discussed with the patient by telephone. The
patient reported doing well after the biopsy with tenderness at the
site. Post biopsy instructions and care were reviewed and questions
were answered. The patient was encouraged to call The [REDACTED]

The patient was referred to [REDACTED]
[REDACTED] at [REDACTED] on
[DATE].

Recommend breast MRI given breast density and high-grade histology.
LEFT axillary ultrasound recommended given focal suspicion for
microinvasion of disease. Request for scheduling both procedures
sent via secure email as directed on [DATE].

Pathology results reported by VIV RN on [DATE].



Using sterile technique and 1% Lidocaine as local anesthetic, under
stereotactic guidance, a 9 gauge vacuum assisted device was used to
perform core needle biopsy of calcifications in the far posterior
upper outer quadrant using a lateral approach. Specimen radiograph
was performed showing calcifications within several specimens.
Specimens with calcifications are identified for pathology.

Lesion quadrant: Upper outer quadrant

At the conclusion of the procedure, a coil shaped tissue marker clip
was deployed into the biopsy cavity. Follow-up 2-view mammogram was
performed and dictated separately.
IMPRESSION: Stereotactic-guided biopsy of the left breast. No apparent
complications.

## 2021-05-16 IMAGING — MG MM BREAST LOCALIZATION CLIP
2 series · 3 of 6 positions shown · non-contrast
Comparison: Previous exam(s).

CLINICAL DATA: Status post left breast stereotactic biopsy.

EXAM:
3D DIAGNOSTIC LEFT MAMMOGRAM POST STEREOTACTIC BIOPSY

[L CC synth-2D]
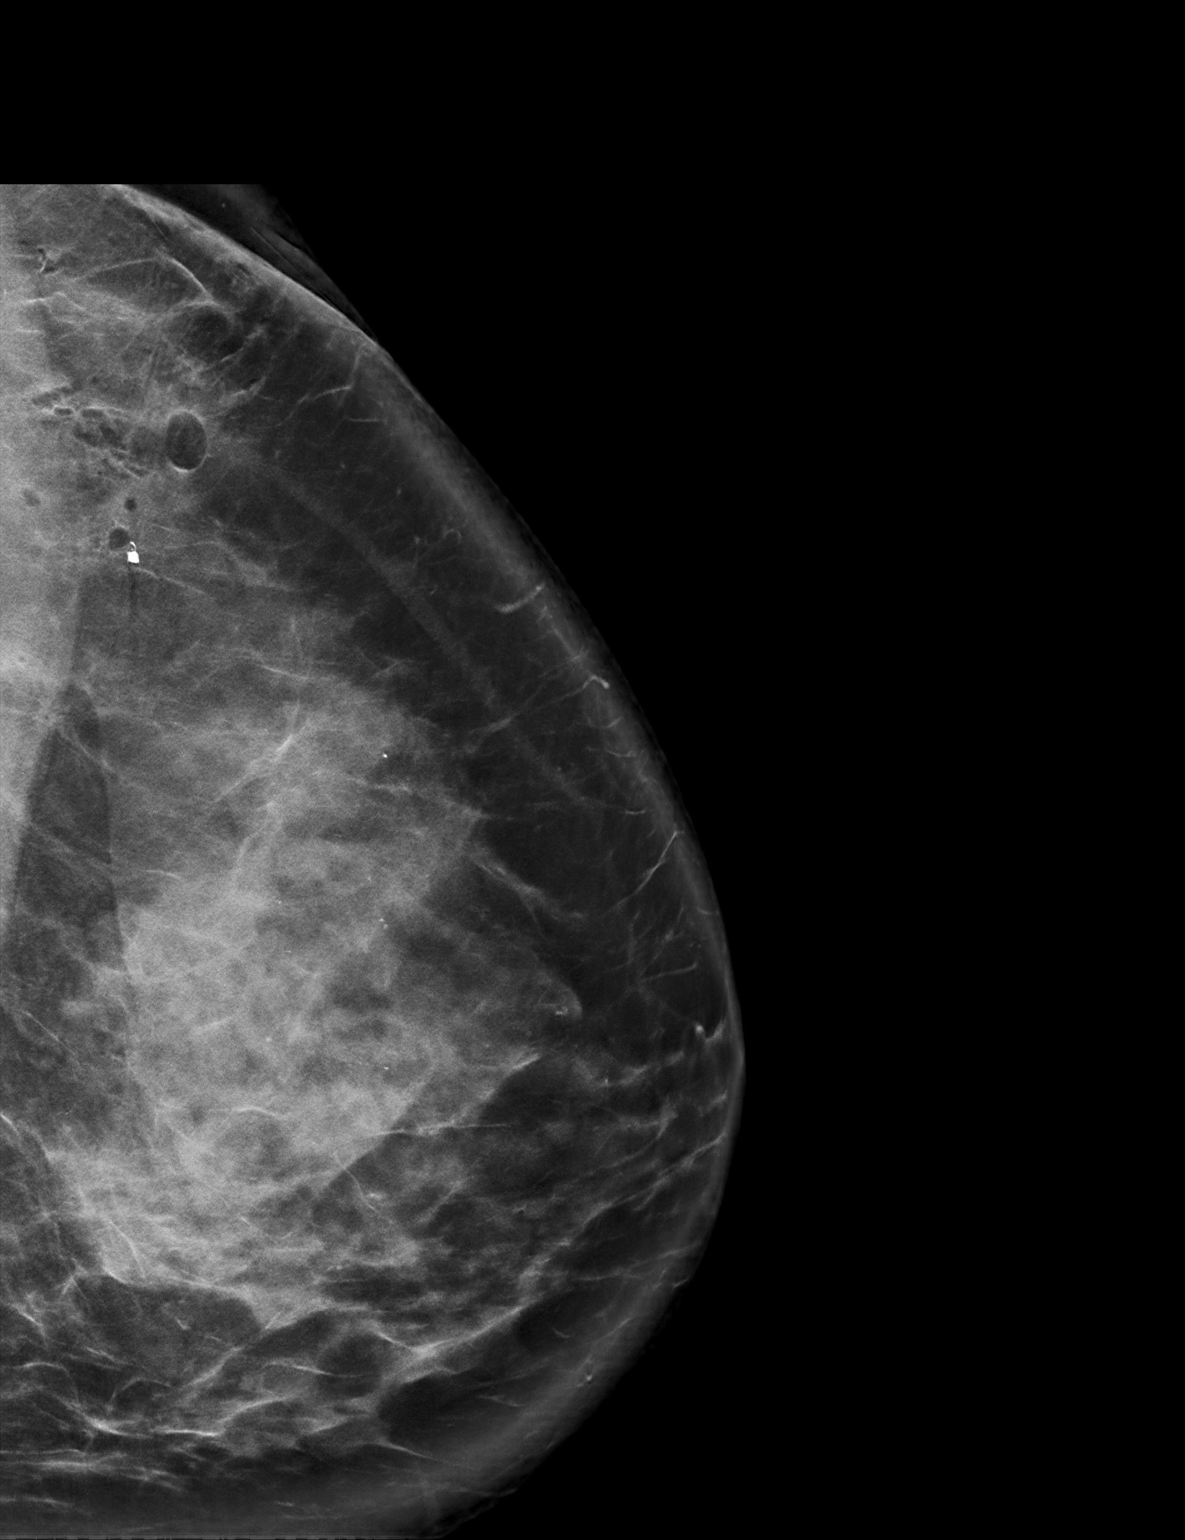

[L LM tomo · 2 of 91 frames shown]
[frame 30/91]
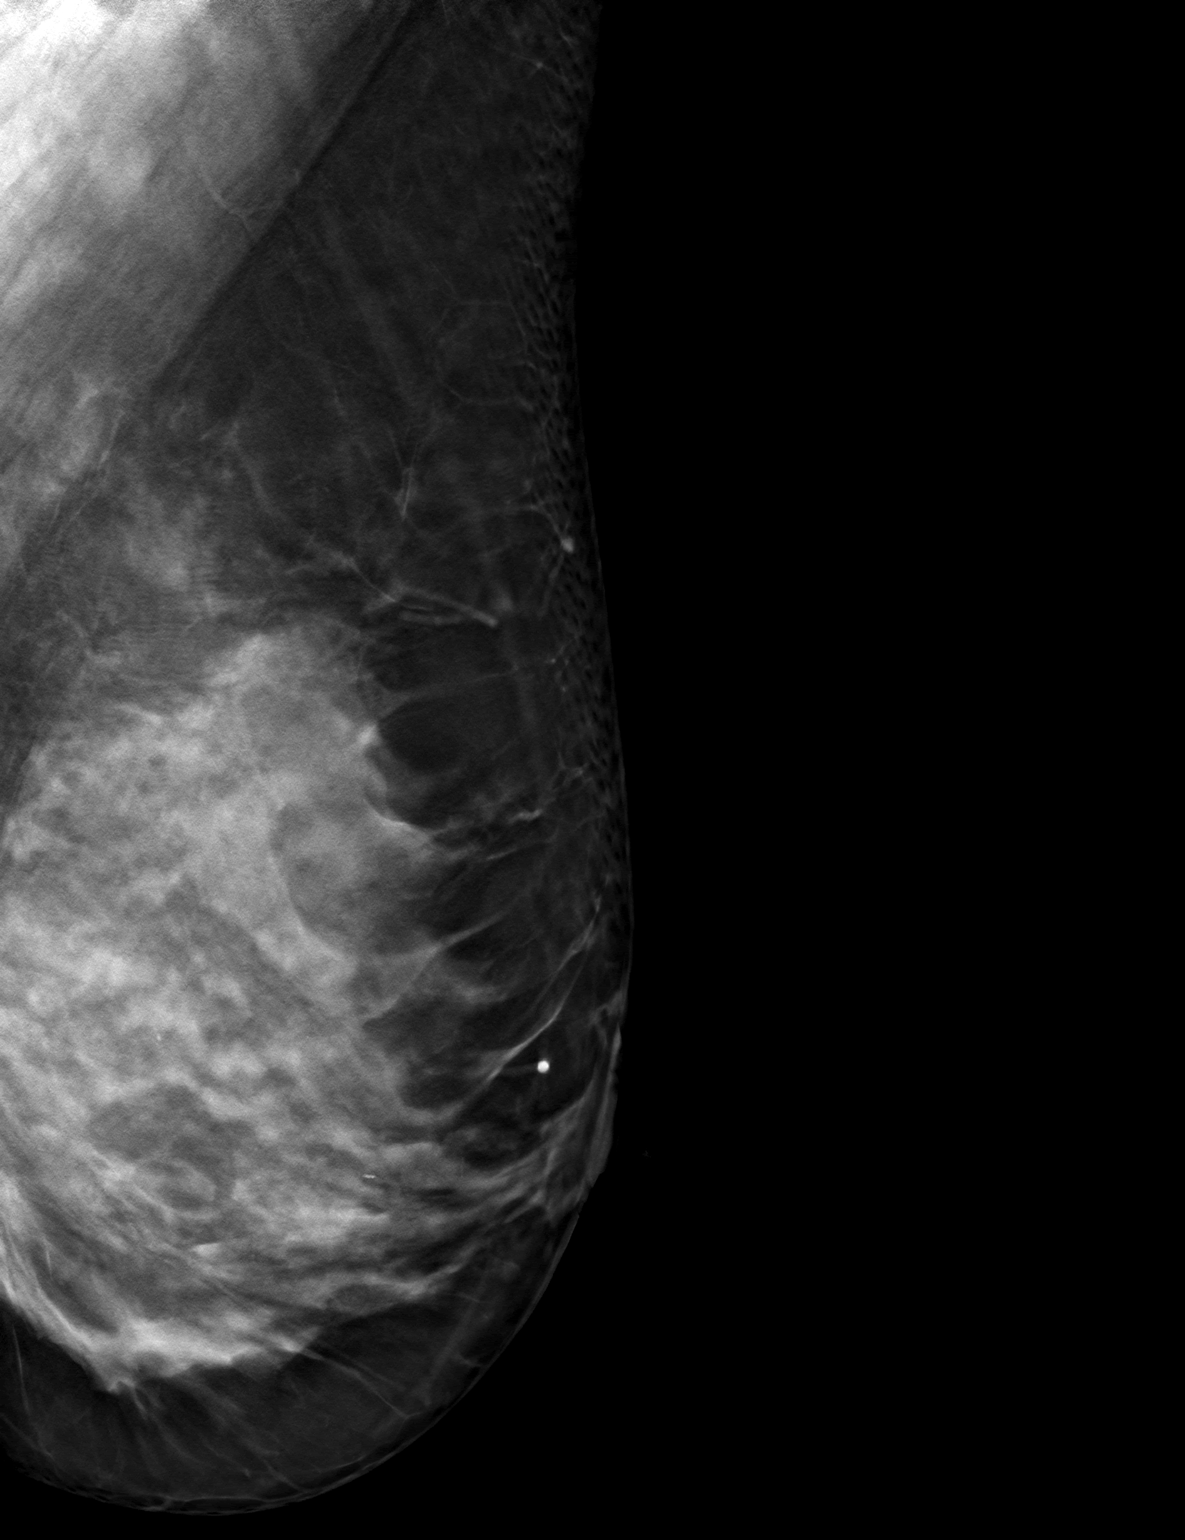
[frame 46/91]
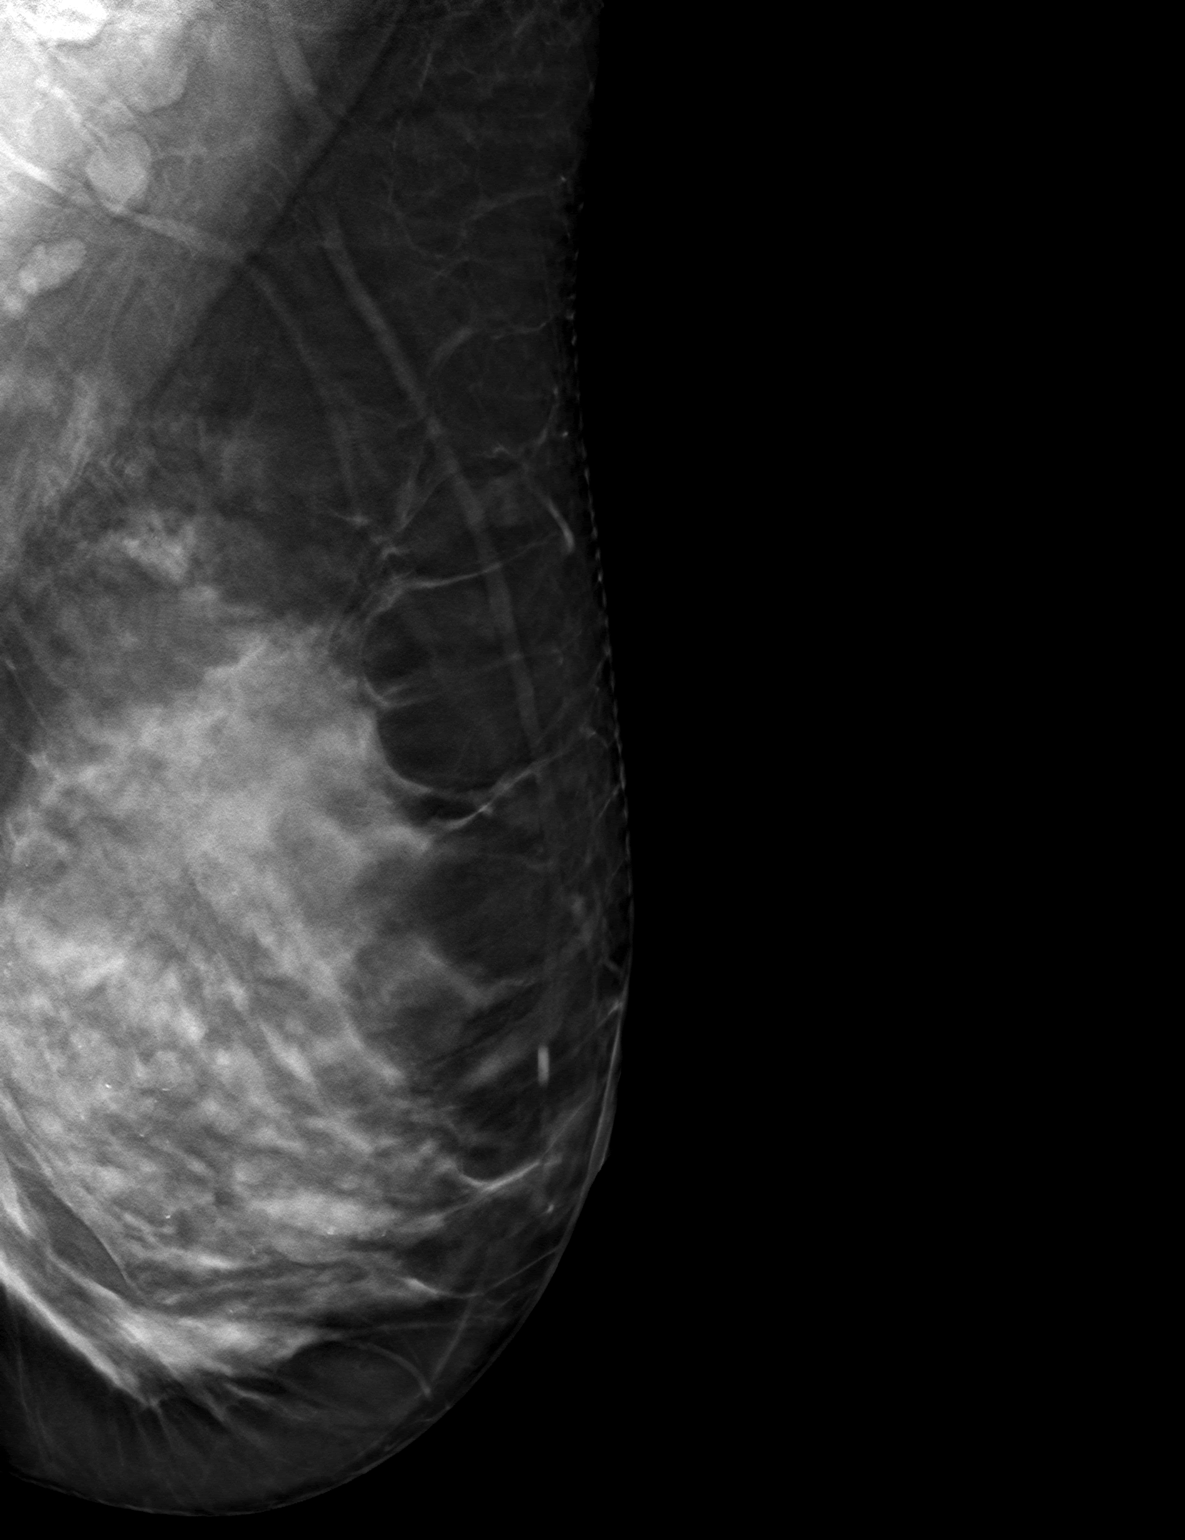

[3 of 6 positions shown; findings below may reference images not displayed]

FINDINGS: 3D Mammographic images were obtained following stereotactic guided
biopsy of the left breast. The biopsy marking clip is in expected
position at the site of biopsy.
IMPRESSION: Appropriate positioning of the coil shaped biopsy marking clip at
the site of biopsy in the upper outer left breast.

Final Assessment: Post Procedure Mammograms for Marker Placement

## 2021-05-20 ENCOUNTER — Encounter: Payer: Self-pay | Admitting: *Deleted

## 2021-05-20 ENCOUNTER — Telehealth: Payer: Self-pay | Admitting: *Deleted

## 2021-05-20 ENCOUNTER — Other Ambulatory Visit: Payer: Self-pay | Admitting: Family Medicine

## 2021-05-20 DIAGNOSIS — C50912 Malignant neoplasm of unspecified site of left female breast: Secondary | ICD-10-CM

## 2021-05-20 DIAGNOSIS — D0512 Intraductal carcinoma in situ of left breast: Secondary | ICD-10-CM

## 2021-05-20 NOTE — Telephone Encounter (Signed)
Spoke with patient to confirm [facility] appt for 05/28/21 at 1215pm. Gave patient instructions and emailed and mailed packet. Contact information given.

## 2021-05-22 ENCOUNTER — Ambulatory Visit
Admission: RE | Admit: 2021-05-22 | Discharge: 2021-05-22 | Disposition: A | Discharge status: Home / Self Care | Payer: BC Managed Care – PPO | Source: Ambulatory Visit | Attending: Family Medicine | Admitting: Family Medicine

## 2021-05-22 ENCOUNTER — Other Ambulatory Visit: Payer: Self-pay

## 2021-05-22 DIAGNOSIS — C50912 Malignant neoplasm of unspecified site of left female breast: Secondary | ICD-10-CM

## 2021-05-22 IMAGING — US US AXILLARY LEFT
1 series · 8 of 8 positions shown · non-contrast
Comparison: Previous exam(s).

CLINICAL DATA: 50-year-old female recently diagnosed with
high-grade DCIS with suspicion for focal micro invasion in the left
breast. Ultrasound of the left axilla is requested to evaluate her
lymph nodes.

EXAM:
ULTRASOUND OF THE LEFT AXILLA

[Series 1: us axillary left · 0.09mm/px · 8 of 8 slices shown]
[im 1/8]
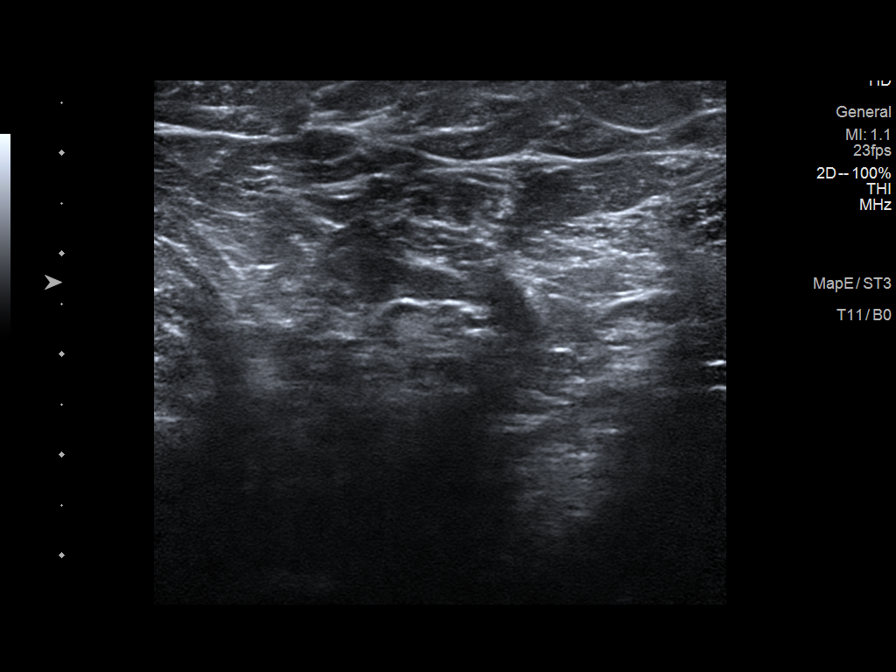
[im 2/8]
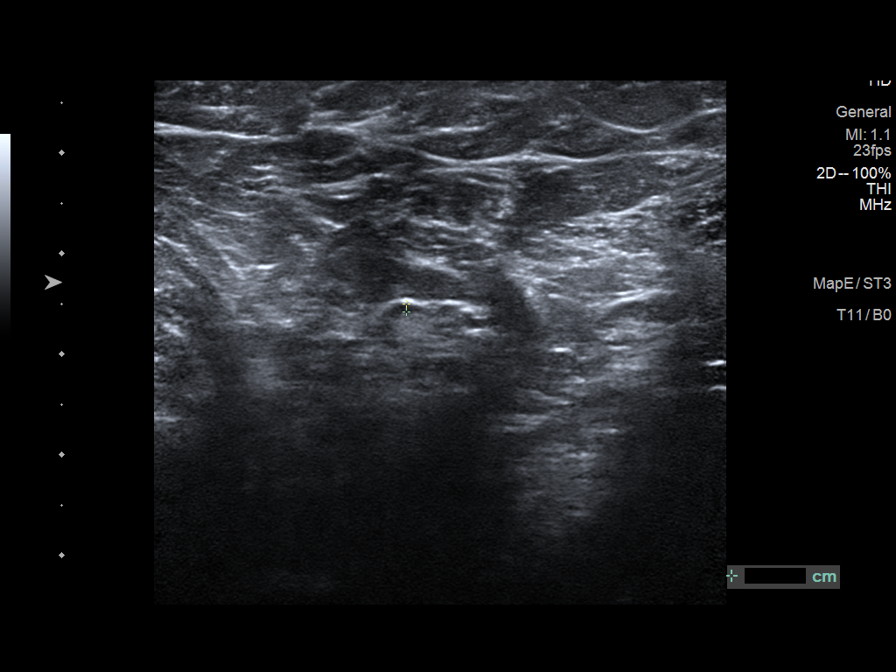
[im 3/8]
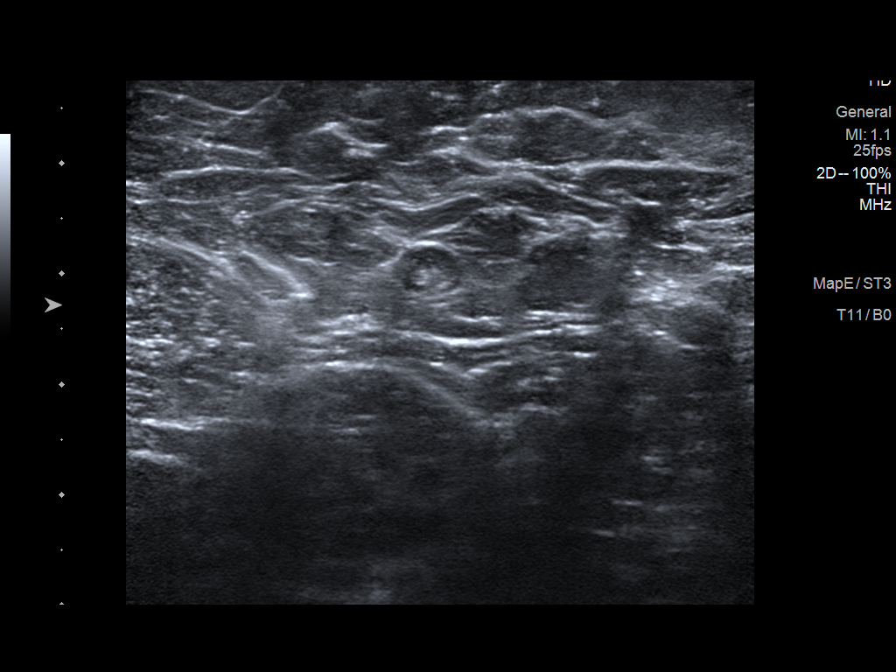
[im 4/8]
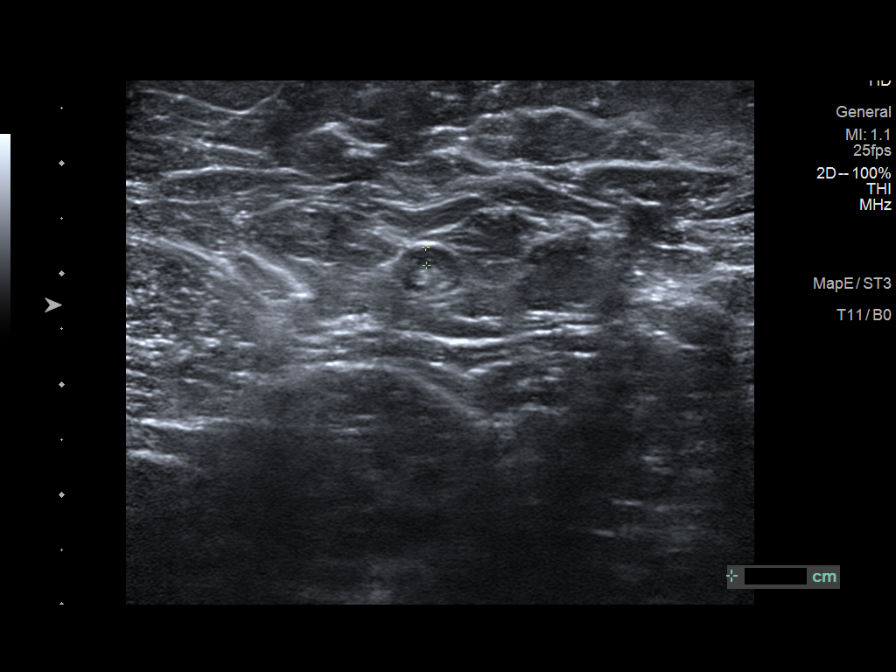
[im 5/8]
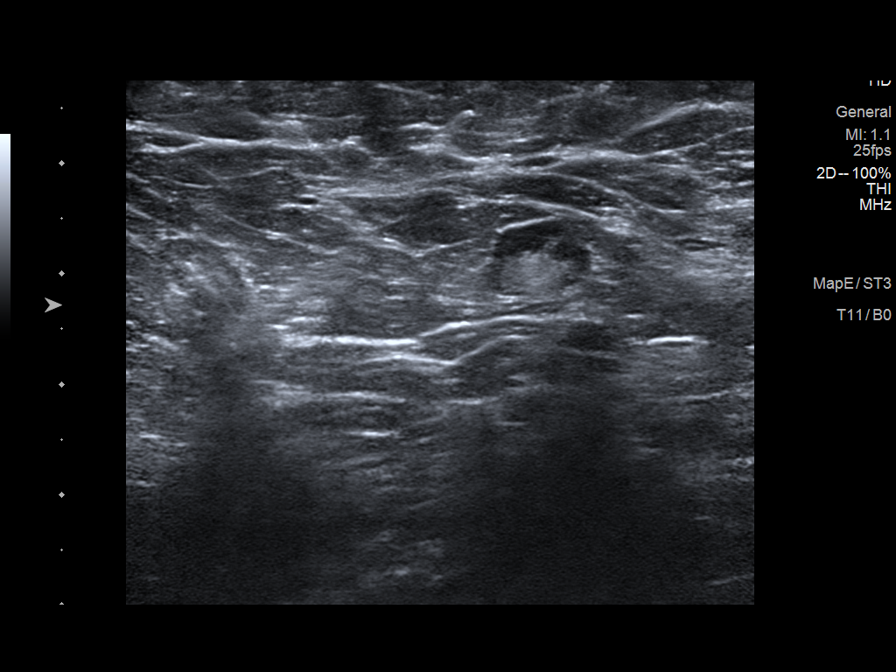
[im 6/8]
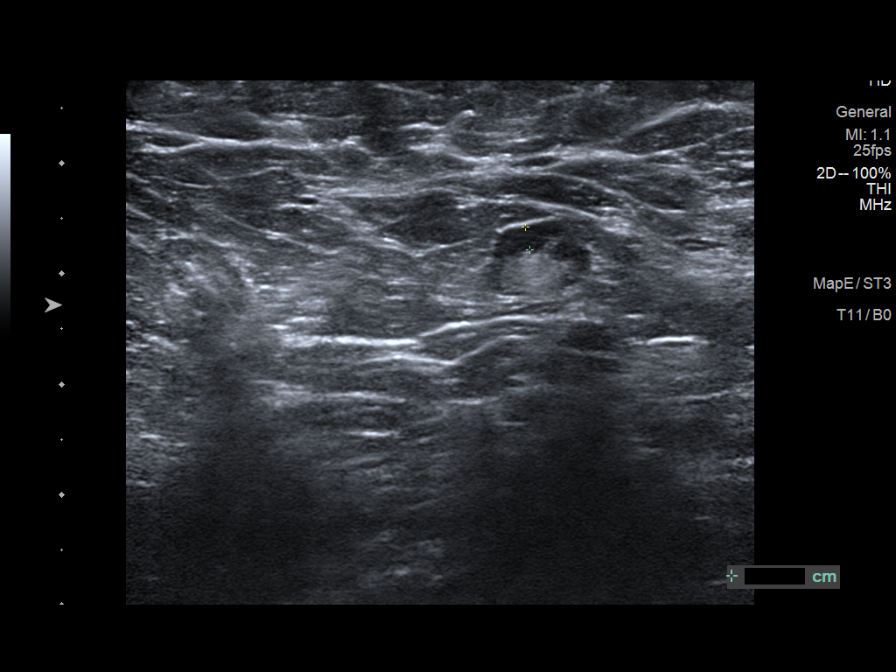
[im 7/8]
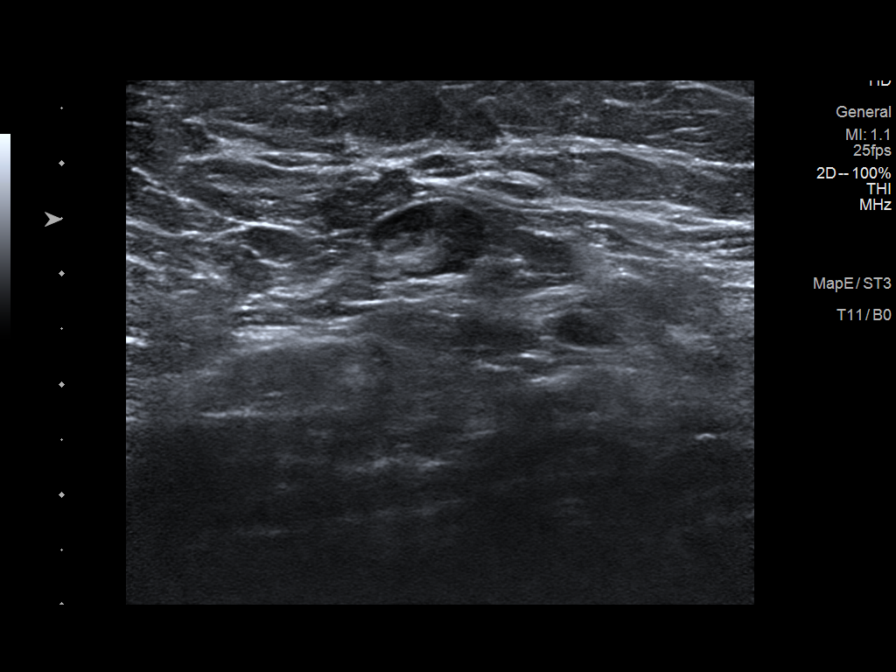
[im 8/8]
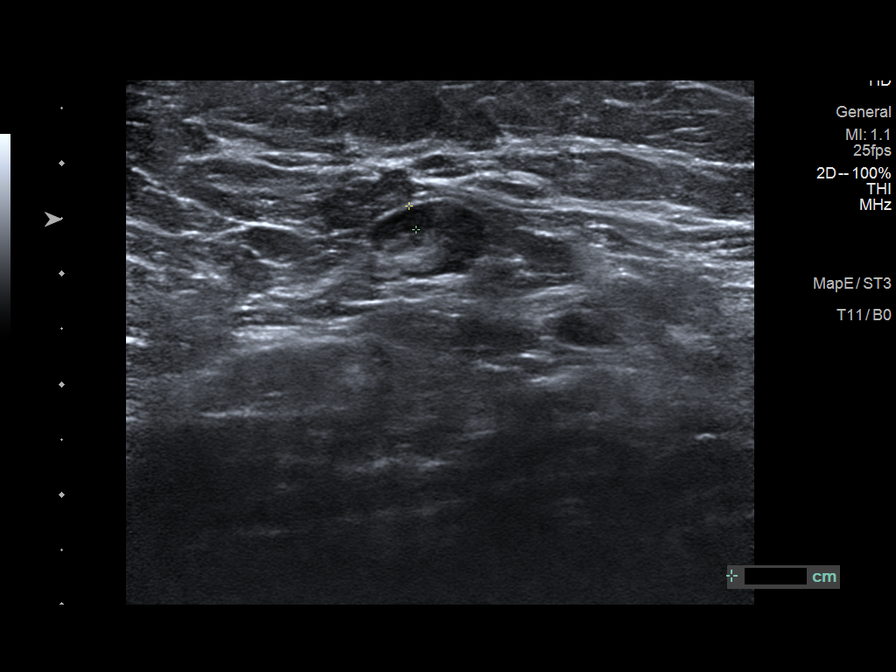

[8 of 8 positions shown; findings below may reference images not displayed]

FINDINGS: Ultrasound of the left axilla demonstrates multiple normal-appearing
lymph nodes.
IMPRESSION: No evidence of left axillary lymphadenopathy.

RECOMMENDATION:
Continued treatment plan. The patient is scheduled for breast MRI
tomorrow.

I have discussed the findings and recommendations with the patient.
If applicable, a reminder letter will be sent to the patient
regarding the next appointment.

BI-RADS CATEGORY  1: Negative.

## 2021-05-23 ENCOUNTER — Ambulatory Visit
Admission: RE | Admit: 2021-05-23 | Discharge: 2021-05-23 | Disposition: A | Discharge status: Home / Self Care | Payer: BC Managed Care – PPO | Source: Ambulatory Visit | Attending: Family Medicine | Admitting: Family Medicine

## 2021-05-23 DIAGNOSIS — C50912 Malignant neoplasm of unspecified site of left female breast: Secondary | ICD-10-CM

## 2021-05-23 IMAGING — MR MR BREAST BILAT WO/W CM
11 of 13 series · 32 of 48 positions shown · IV contrast (8 ml gadavist)
Comparison: Previous exam(s).

CLINICAL DATA: Biopsy proven high-grade ductal carcinoma in-situ
with necrosis and calcifications, focally suspicious for micro
invasion in the upper-outer quadrant of the left breast.

LABS:  None obtained on site.
EXAM:
BILATERAL BREAST MRI WITH AND WITHOUT CONTRAST
TECHNIQUE: Multiplanar, multisequence MR images of both breasts were obtained
prior to and following the intravenous administration of 8 ml of
Gadavist

[Series 2: t2_tirm_tra ipat (a-p) · axial · 3.0mm · 0.70mm/px · 1 of 55 slices shown]
[im 1/55]
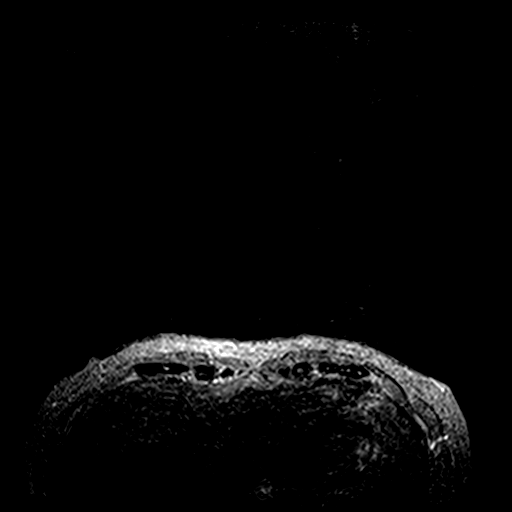

[Series 3: fl3d pre-cm no · axial · non-contrast · 1.2mm · 0.94mm/px · z∈[-69,+103]mm · 3 of 144 slices shown]
[im 1/144]
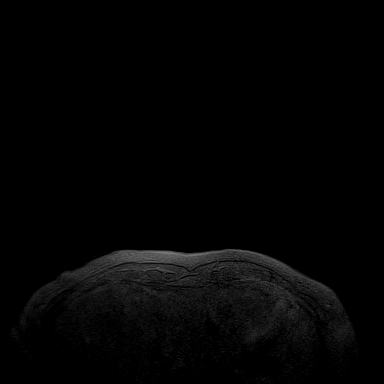
[im 72/144]
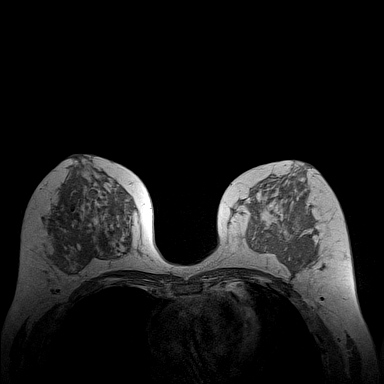
[im 144/144]
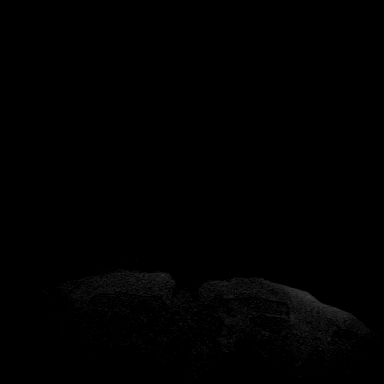

[Series 4: fl3d pre-cm · axial · non-contrast · 1.2mm · 0.94mm/px · z∈[-69,+103]mm · 3 of 144 slices shown]
[im 1/144]
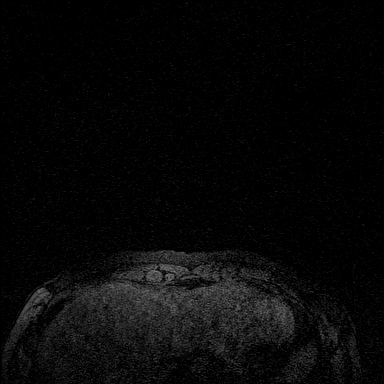
[im 72/144]
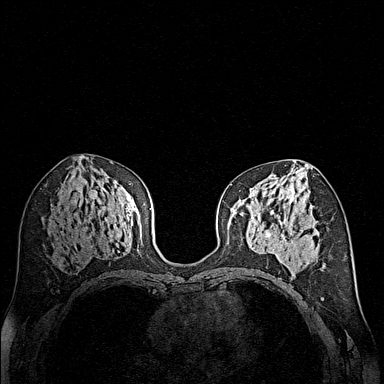
[im 144/144]
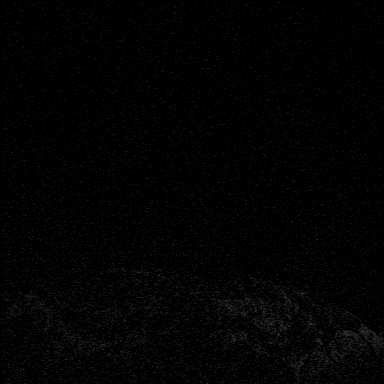

[Series 5: fl3d post-cm 20 · axial · 1.2mm · 0.94mm/px · z∈[-69,+103]mm · 3 of 144 slices shown (1 of 3)]
[im 1/144]
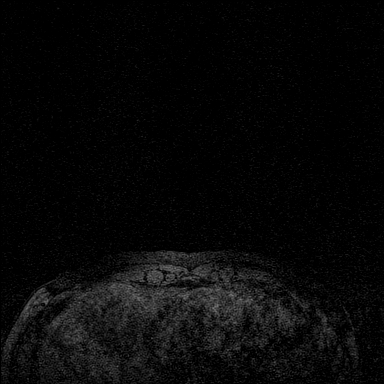
[im 72/144]
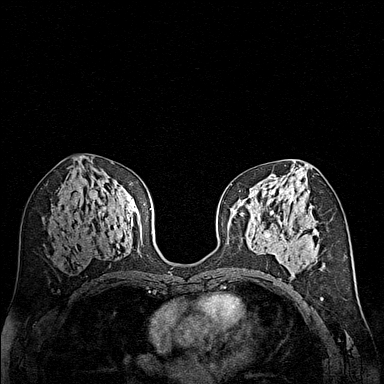
[im 144/144]
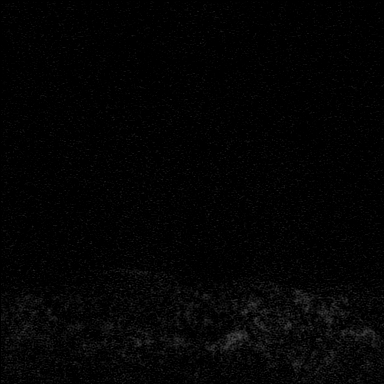

[Series 6: fl3d post-cm 20 · axial · 1.2mm · 0.94mm/px · z∈[-69,+103]mm · 4 of 144 slices shown (2 of 3)]
[im 1/144]
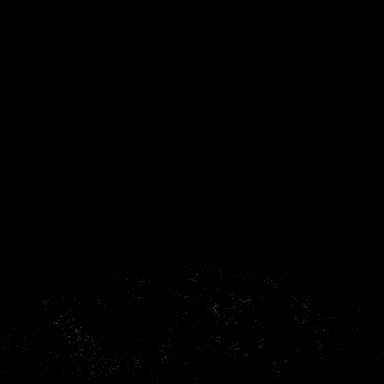
[im 48/144]
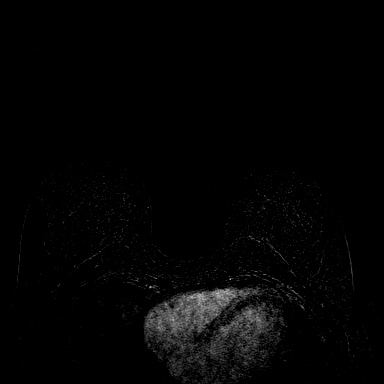
[im 96/144]
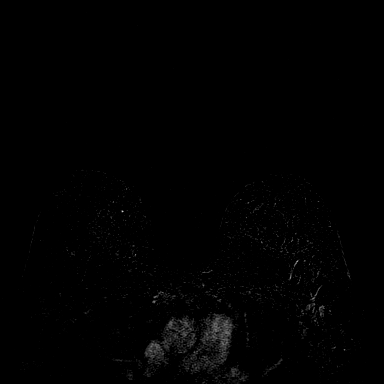
[im 144/144]
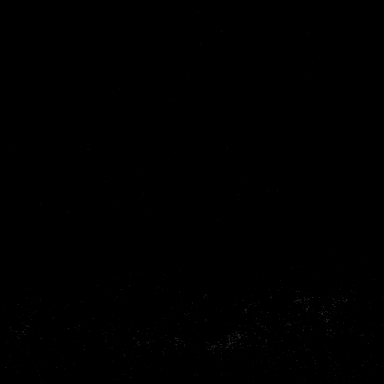

[Series 7: fl3d post-cm 20 · axial · 172.8mm · 0.94mm/px · 1 of 1 slices shown (3 of 3)]
[im 1/1]
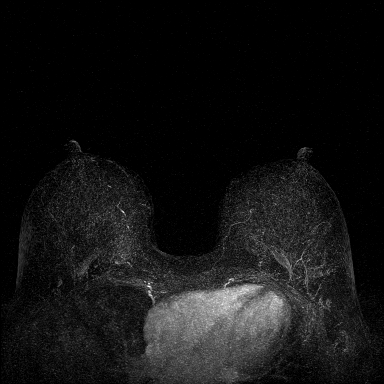

[Series 8: fl3d post-cm 3min · axial · 1.2mm · 0.94mm/px · z∈[-69,+103]mm · 4 of 144 slices shown]
[im 1/144]
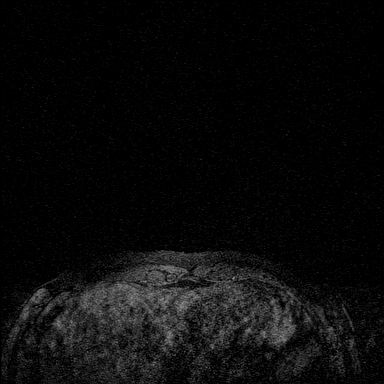
[im 48/144]
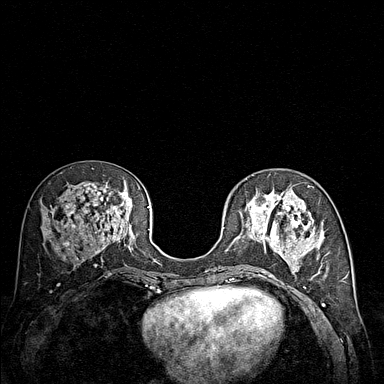
[im 96/144]
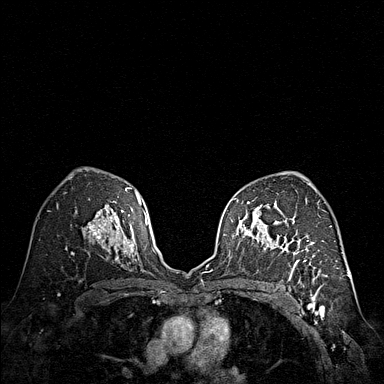
[im 144/144]
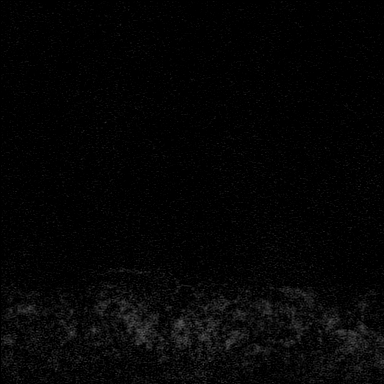

[Series 9: fl3d post-cm 3min_sub · axial · 1.2mm · 0.94mm/px · z∈[-69,+103]mm · 4 of 144 slices shown]
[im 1/144]
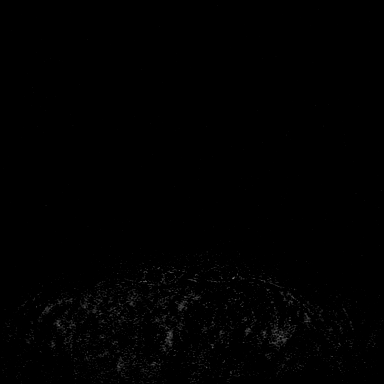
[im 48/144]
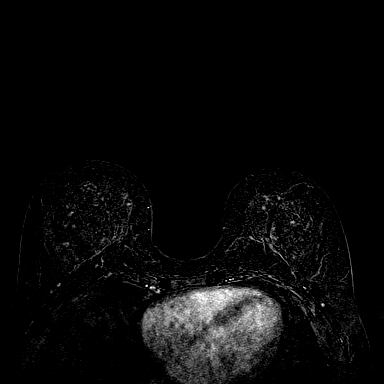
[im 96/144]
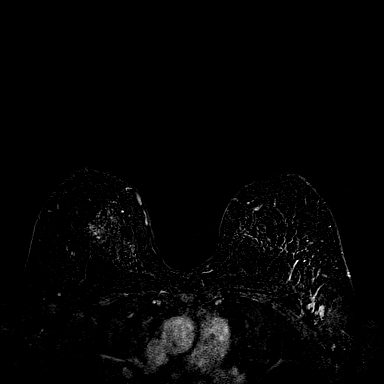
[im 144/144]
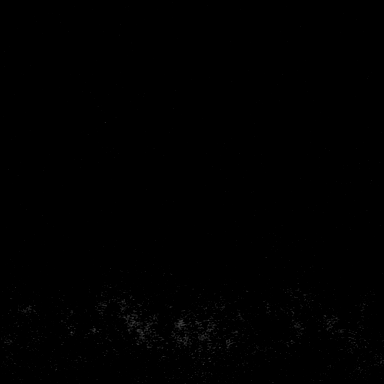

[Series 10: fl3d post-cm 3min_sub_mip_tra · axial · 172.8mm · 0.94mm/px · 1 of 1 slices shown]
[im 1/1]
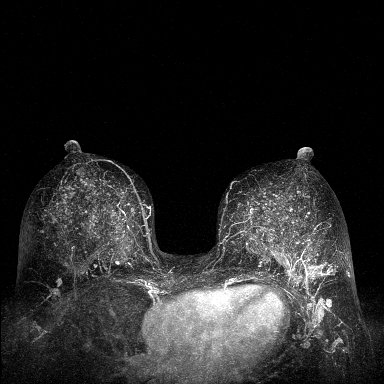

[Series 11: fl3d post-cm 5min · axial · 1.2mm · 0.94mm/px · z∈[-69,+103]mm · 4 of 144 slices shown]
[im 1/144]
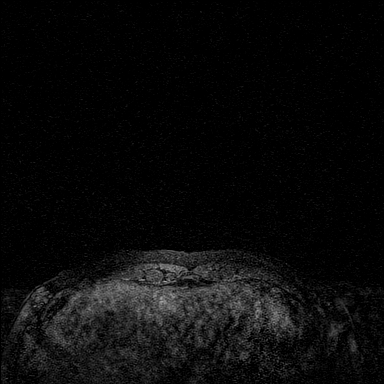
[im 48/144]
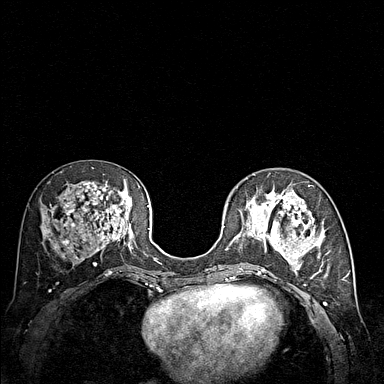
[im 96/144]
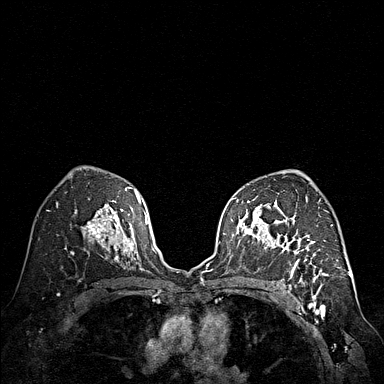
[im 144/144]
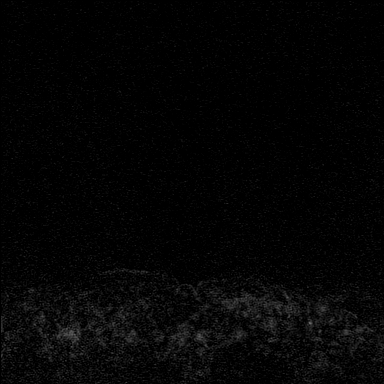

[Series 12: fl3d post-cm 5min_sub · axial · 1.2mm · 0.94mm/px · z∈[-69,+103]mm · 4 of 144 slices shown]
[im 1/144]
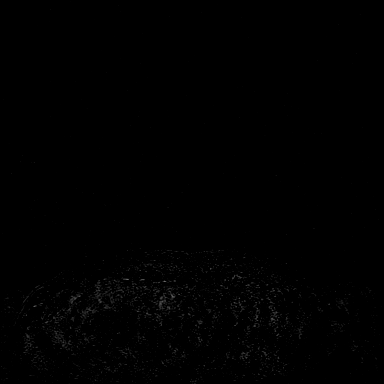
[im 48/144]
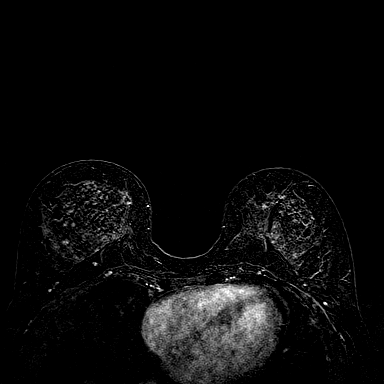
[im 96/144]
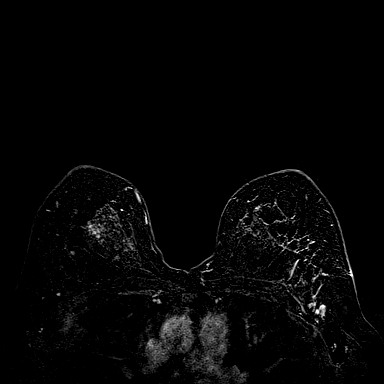
[im 144/144]
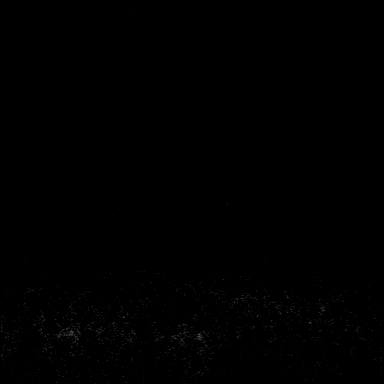

[32 of 48 positions shown; findings below may reference images not displayed]

Three-dimensional MR images were rendered by post-processing of the
original MR data on an independent workstation. The
three-dimensional MR images were interpreted, and findings are
reported in the following complete MRI report for this study. Three
dimensional images were evaluated at the independent interpreting
workstation using the DynaCAD thin client.
FINDINGS: Breast composition: d. Extreme fibroglandular tissue.

Background parenchymal enhancement: Moderate

Right breast: No mass or abnormal enhancement.

Left breast: Post biopsy changes with surrounding enhancement is
seen in the lateral aspect of the left breast (series 6, image 58).
The post biopsy changes and enhancement measures 4.6 cm. Signal void
artifact is seen at the biopsy site from the biopsy marker clip.

Lymph nodes: In the left axilla there are approximately 7 lymph
nodes. Some of these appear to have cortical thickening measuring up
to 5 mm.

Ancillary findings:  None.
IMPRESSION: Post biopsy changes in enhancement in the lateral aspect of the left
breast measuring 4.6 cm.

Prominent left axillary lymph nodes.

RECOMMENDATION:
Sonographic evaluation of the left axilla is recommended. If
enlarged lymph nodes are visualized biopsy would be suggested.

Treatment planning of the biopsy proven high-grade ductal carcinoma
with calcifications and necrosis, focally suspicious for micro
invasion is recommended.

BI-RADS CATEGORY  4: Suspicious.

## 2021-05-23 MED ORDER — GADOBUTROL 1 MMOL/ML IV SOLN
8.0000 mL | Freq: Once | INTRAVENOUS | Status: AC | PRN
  Administered 2021-05-23: 8 mL via INTRAVENOUS

## 2021-05-26 ENCOUNTER — Encounter: Payer: Self-pay | Admitting: *Deleted

## 2021-05-27 NOTE — Progress Notes (Signed)
[facility] NOTE  Patient Care Team: Rankins, Bill Salinas, MD as PCP - General (Family Medicine) Rockwell Germany, RN as Oncology Nurse Navigator Mauro Kaufmann, RN as Oncology Nurse Navigator Stark Klein, MD as Consulting Physician (General Surgery) Nicholas Lose, MD as Consulting Physician (Hematology and Oncology) Kyung Rudd, MD as Consulting Physician (Radiation Oncology)  CHIEF COMPLAINTS/PURPOSE OF CONSULTATION:  Newly diagnosed left breast cancer  HISTORY OF PRESENTING ILLNESS:  Sarah Vincent 50 y.o. female is here because of recent diagnosis of DCIS of the left breast. Screening mammogram on 04/22/21 showed calcifications in the left breast and no malignancy in the right breast. Diagnostic mammogram and [place] on 05/09/21 showed indeterminate left breast calcifications spanning up to 1.8 cm. Biopsy on 05/16/21 showed high-grade DCIS with necrosis and calcifications, focally suspicious for microinvasion ER/PR+ (95%). She presents to the clinic today for initial evaluation and discussion of treatment options.   I reviewed her records extensively and collaborated the history with the patient.  SUMMARY OF ONCOLOGIC HISTORY: Oncology History  Ductal carcinoma in situ (DCIS) of left breast  05/20/2021 Initial Diagnosis   Screening mammogram: calcifications in the left breast and no malignancy in the right breast. Diagnostic mammogram and [place]: indeterminate left breast calcifications spanning up to 1.8 cm. Biopsy: high-grade DCIS with necrosis and calcifications, focally suspicious for microinvasion ER/PR+ (95%).    05/28/2021 Cancer Staging   Staging form: Breast, AJCC 8th Edition - Clinical stage from 05/28/2021: Stage 0 (cTis (DCIS), cN0, cM0, ER+, PR+) - Signed by Nicholas Lose, MD on 05/28/2021  Stage prefix: Initial diagnosis  Nuclear grade: G3      MEDICAL HISTORY:  Past Medical History:  Diagnosis Date   Breast cancer ([place])     SURGICAL  HISTORY: History reviewed. No pertinent surgical history.  SOCIAL HISTORY: Social History   Socioeconomic History   Marital status: Married    Spouse name: Not on file   Number of children: Not on file   Years of education: Not on file   Highest education level: Not on file  Occupational History   Not on file  Tobacco Use   Smoking status: Never   Smokeless tobacco: Never  Substance and Sexual Activity   Alcohol use: Yes    Comment: 1-2   Drug use: Not on file   Sexual activity: Not on file  Other Topics Concern   Not on file  Social History Narrative   Not on file   Social Determinants of Health   Financial Resource Strain: Not on file  Food Insecurity: Not on file  Transportation Needs: Not on file  Physical Activity: Not on file  Stress: Not on file  Social Connections: Not on file  Intimate Partner Violence: Not on file    FAMILY HISTORY: Family History  Problem Relation Age of Onset   Liver cancer Paternal Grandmother    Bladder Cancer Paternal Grandfather     ALLERGIES:  is allergic to penicillins.  MEDICATIONS:  Current Outpatient Medications  Medication Sig Dispense Refill   rOPINIRole (REQUIP) 0.5 MG tablet Take 0.5 mg by mouth daily.     sertraline (ZOLOFT) 100 MG tablet Take 100 mg by mouth daily.     spironolactone (ALDACTONE) 25 MG tablet Take 25 mg by mouth 2 (two) times daily.     No current facility-administered medications for this visit.    REVIEW OF SYSTEMS:   Constitutional: Denies fevers, chills or abnormal night sweats Eyes: Denies blurriness of vision,  double vision or watery eyes Ears, nose, mouth, throat, and face: Denies mucositis or sore throat Respiratory: Denies cough, dyspnea or wheezes Cardiovascular: Denies palpitation, chest discomfort or lower extremity swelling Gastrointestinal:  Denies nausea, heartburn or change in bowel habits Skin: Denies abnormal skin rashes Lymphatics: Denies new lymphadenopathy or easy  bruising Neurological:Denies numbness, tingling or new weaknesses Behavioral/Psych: Mood is stable, no new changes  Breast:  Denies any palpable lumps or discharge All other systems were reviewed with the patient and are negative.  PHYSICAL EXAMINATION: ECOG PERFORMANCE STATUS: 0 - Asymptomatic  Vitals:   05/28/21 1257  BP: (!) 151/70  Pulse: 70  Resp: 18  Temp: 98.2 F (36.8 C)   Filed Weights   05/28/21 1257  Weight: 172 lb (78 kg)     LABORATORY DATA:  I have reviewed the data as listed Lab Results  Component Value Date   WBC 6.9 05/28/2021   HGB 12.3 05/28/2021   HCT 36.1 05/28/2021   MCV 90.5 05/28/2021   PLT 217 05/28/2021   Lab Results  Component Value Date   NA 139 05/28/2021   K 4.3 05/28/2021   CL 104 05/28/2021   CO2 25 05/28/2021    RADIOGRAPHIC STUDIES: I have personally reviewed the radiological reports and agreed with the findings in the report.  ASSESSMENT AND PLAN:  Ductal carcinoma in situ (DCIS) of left breast Screening mammogram detected left breast calcifications UOQ: 1.8 cm, stereotactic biopsy revealed high-grade DCIS with necrosis and calcifications with focal suspicious microinvasion, ER 95%, PR 5%, axilla negative, breast MRI revealed 4.6 cm area of non-mass enhancement, multiple axillary lymph nodes at least 7.  Pathology review: I discussed with the patient the difference between DCIS and invasive breast cancer. It is considered a precancerous lesion. DCIS is classified as a 0. It is generally detected through mammograms as calcifications. We discussed the significance of grades and its impact on prognosis. We also discussed the importance of ER and PR receptors and their implications to adjuvant treatment options. Prognosis of DCIS dependence on grade, comedo necrosis. It is anticipated that if not treated, 20-30% of DCIS can develop into invasive breast cancer.  Recommendation: 1. Breast conserving surgery versus mastectomy depending on  MRI guided biopsy of the additional non-mass enhancement 2. Followed by adjuvant radiation therapy if she undergoes lumpectomy 3. Followed by antiestrogen therapy with tamoxifen 5 years Genetic testing will be done  Tamoxifen counseling: We discussed the risks and benefits of tamoxifen. These include but not limited to insomnia, hot flashes, mood changes, vaginal dryness, and weight gain. Although rare, serious side effects including endometrial cancer, risk of blood clots were also discussed. We strongly believe that the benefits far outweigh the risks. Patient understands these risks and consented to starting treatment. Planned treatment duration is 5 years.  We will discuss with radiology about the axillary lymph nodes and whether or not anything further needs to be done.  Return to clinic after surgery to discuss the final pathology report and come up with an adjuvant treatment plan.    All questions were answered. The patient knows to call the clinic with any problems, questions or concerns.   Rulon Eisenmenger, MD, MPH 05/28/2021    I, Thana Ates, am acting as scribe for Nicholas Lose, MD.  I have reviewed the above documentation for accuracy and completeness, and I agree with the above.

## 2021-05-28 ENCOUNTER — Ambulatory Visit (HOSPITAL_BASED_OUTPATIENT_CLINIC_OR_DEPARTMENT_OTHER): Payer: BC Managed Care – PPO | Admitting: Genetic Counselor

## 2021-05-28 ENCOUNTER — Other Ambulatory Visit: Payer: Self-pay | Admitting: General Surgery

## 2021-05-28 ENCOUNTER — Ambulatory Visit
Admission: RE | Admit: 2021-05-28 | Discharge: 2021-05-28 | Disposition: A | Discharge status: Home / Self Care | Payer: BC Managed Care – PPO | Source: Ambulatory Visit | Attending: Radiation Oncology | Admitting: Radiation Oncology

## 2021-05-28 ENCOUNTER — Encounter: Payer: Self-pay | Admitting: General Practice

## 2021-05-28 ENCOUNTER — Encounter: Payer: Self-pay | Admitting: Hematology and Oncology

## 2021-05-28 ENCOUNTER — Other Ambulatory Visit: Payer: Self-pay | Admitting: *Deleted

## 2021-05-28 ENCOUNTER — Inpatient Hospital Stay: Payer: BC Managed Care – PPO | Attending: Hematology and Oncology | Admitting: Hematology and Oncology

## 2021-05-28 ENCOUNTER — Inpatient Hospital Stay: Payer: BC Managed Care – PPO

## 2021-05-28 ENCOUNTER — Other Ambulatory Visit: Payer: Self-pay

## 2021-05-28 DIAGNOSIS — Z8052 Family history of malignant neoplasm of bladder: Secondary | ICD-10-CM | POA: Diagnosis not present

## 2021-05-28 DIAGNOSIS — D0512 Intraductal carcinoma in situ of left breast: Secondary | ICD-10-CM | POA: Diagnosis not present

## 2021-05-28 DIAGNOSIS — Z808 Family history of malignant neoplasm of other organs or systems: Secondary | ICD-10-CM | POA: Insufficient documentation

## 2021-05-28 DIAGNOSIS — Z800 Family history of malignant neoplasm of digestive organs: Secondary | ICD-10-CM

## 2021-05-28 LAB — CBC WITH DIFFERENTIAL (CANCER CENTER ONLY)
Abs Immature Granulocytes: 0.01 10*3/uL (ref 0.00–0.07)
Basophils Absolute: 0 10*3/uL (ref 0.0–0.1)
Basophils Relative: 1 %
Eosinophils Absolute: 0.2 10*3/uL (ref 0.0–0.5)
Eosinophils Relative: 2 %
HCT: 36.1 % (ref 36.0–46.0)
Hemoglobin: 12.3 g/dL (ref 12.0–15.0)
Immature Granulocytes: 0 %
Lymphocytes Relative: 22 %
Lymphs Abs: 1.5 10*3/uL (ref 0.7–4.0)
MCH: 30.8 pg (ref 26.0–34.0)
MCHC: 34.1 g/dL (ref 30.0–36.0)
MCV: 90.5 fL (ref 80.0–100.0)
Monocytes Absolute: 0.6 10*3/uL (ref 0.1–1.0)
Monocytes Relative: 9 %
Neutro Abs: 4.6 10*3/uL (ref 1.7–7.7)
Neutrophils Relative %: 66 %
Platelet Count: 217 10*3/uL (ref 150–400)
RBC: 3.99 MIL/uL (ref 3.87–5.11)
RDW: 12.2 % (ref 11.5–15.5)
WBC Count: 6.9 10*3/uL (ref 4.0–10.5)
nRBC: 0 % (ref 0.0–0.2)

## 2021-05-28 LAB — CMP (CANCER CENTER ONLY)
ALT: 12 U/L (ref 0–44)
AST: 16 U/L (ref 15–41)
Albumin: 4.2 g/dL (ref 3.5–5.0)
Alkaline Phosphatase: 95 U/L (ref 38–126)
Anion gap: 10 (ref 5–15)
BUN: 17 mg/dL (ref 6–20)
CO2: 25 mmol/L (ref 22–32)
Calcium: 9.4 mg/dL (ref 8.9–10.3)
Chloride: 104 mmol/L (ref 98–111)
Creatinine: 0.71 mg/dL (ref 0.44–1.00)
GFR, Estimated: >60 mL/min (ref >60)
Glucose, Bld: 97 mg/dL (ref 70–99)
Potassium: 4.3 mmol/L (ref 3.5–5.1)
Sodium: 139 mmol/L (ref 135–145)
Total Bilirubin: 0.4 mg/dL (ref 0.3–1.2)
Total Protein: 7.5 g/dL (ref 6.5–8.1)

## 2021-05-28 LAB — GENETIC SCREENING ORDER

## 2021-05-28 NOTE — Progress Notes (Signed)
[facility] Psychosocial Distress Screening Spiritual Care  Met with Sarah Vincent in [facility] to introduce [place] team/resources, reviewing distress screen per protocol.  The patient scored a 7 on the Psychosocial Distress Thermometer which indicates severe distress. Also assessed for distress and other psychosocial needs.   ONCBCN DISTRESS SCREENING 05/28/2021  Screening Type Initial Screening  Distress experienced in past week (1-10) 7  Emotional problem type Adjusting to illness  Information Concerns Type Lack of info about diagnosis;Lack of info about treatment  Referral to support programs Yes   Sarah Vincent reports significantly reduced distress and "feeling blessed" upon learning more details about the scope of her diagnosis. She is married with two boys, ages 66 and 32. She is still discerning about whether she wants to choose lumpectomy or mastectomy.    Follow up needed: Yes.  Placed [facility] peer mentor referral and plan to call in ca 2 weeks for pastoral check-in.   [place], [place], [facility] Pager [phone] Voicemail [phone]

## 2021-05-28 NOTE — Progress Notes (Signed)
Radiation Oncology         [phone]) [phone] ________________________________  Name: Sarah Vincent        MRN: [id number]  Date of Service: 05/28/2021 DOB: 11-Jan-1971  [id number], Bill Salinas, MD  Donnie Mesa, MD     REFERRING PHYSICIAN: Donnie Mesa, MD   DIAGNOSIS: The encounter diagnosis was Ductal carcinoma in situ (DCIS) of left breast.   HISTORY OF PRESENT ILLNESS: Sarah Vincent is a 50 y.o. female seen in the multidisciplinary breast clinic for a new diagnosis of left breast cancer. The patient was noted to have screening detected calcifications in the left breast.  She proceeded with diagnostic imaging which showed an group of calcifications in the upper outer quadrant spanning 1.8 cm in the left breast.  Her axilla was negative by ultrasound for any adenopathy, she underwent stereotactic biopsy which showed high-grade DCIS necrosis and calcifications with possible microinvasive change.  Her tumor was ER/PR positive.  She also had an MRI scan on 05/23/2021 that showed postbiopsy changes and enhancement in the lateral aspect of the left breast measuring 4.6 cm with prominent left axillary nodes.  She is seen today to discuss treatment recommendations of her cancer.    PREVIOUS RADIATION THERAPY: No   PAST MEDICAL HISTORY:  Past Medical History:  Diagnosis Date   Breast cancer ([place])        PAST SURGICAL HISTORY:No past surgical history on file.   FAMILY HISTORY:  Family History  Problem Relation Age of Onset   Liver cancer Paternal Grandmother    Bladder Cancer Paternal Grandfather      SOCIAL HISTORY:  reports that she has never smoked. She has never used smokeless tobacco. She reports current alcohol use. The patient is married and lives in [place].   ALLERGIES: Penicillins   MEDICATIONS:  Current Outpatient Medications  Medication Sig Dispense Refill   rOPINIRole (REQUIP) 0.5 MG tablet Take 0.5 mg by mouth daily.     sertraline (ZOLOFT)  100 MG tablet Take 100 mg by mouth daily.     spironolactone (ALDACTONE) 25 MG tablet Take 25 mg by mouth 2 (two) times daily.     No current facility-administered medications for this encounter.      REVIEW OF SYSTEMS: On review of systems, the patient reports that she is doing well overall. She's had occasional headaches and back pain at times. No other breast specific complaints are noted.      PHYSICAL EXAM:  Wt Readings from Last 3 Encounters:  05/28/21 172 lb (78 kg)   Temp Readings from Last 3 Encounters:  05/28/21 98.2 F (36.8 C) (Temporal)   BP Readings from Last 3 Encounters:  05/28/21 (!) 151/70   Pulse Readings from Last 3 Encounters:  05/28/21 70    In general this is a well appearing caucasian female in no acute distress. She's alert and oriented x4 and appropriate throughout the examination. Cardiopulmonary assessment is negative for acute distress and she exhibits normal effort. Bilateral breast exam is deferred.    ECOG = 0  0 - Asymptomatic (Fully active, able to carry on all predisease activities without restriction)  1 - Symptomatic but completely ambulatory (Restricted in physically strenuous activity but ambulatory and able to carry out work of a light or sedentary nature. For example, light housework, office work)  2 - Symptomatic, <50% in bed during the day (Ambulatory and capable of all self care but unable to carry out any work activities. Up and about more than 50%  of waking hours)  3 - Symptomatic, >50% in bed, but not bedbound (Capable of only limited self-care, confined to bed or chair 50% or more of waking hours)  4 - Bedbound (Completely disabled. Cannot carry on any self-care. Totally confined to bed or chair)  5 - Death   Eustace Pen MM, Creech RH, Tormey DC, et al. [phone]). "Toxicity and response criteria of the [facility]". [place] Oncol. 5 (6): 649-55    LABORATORY DATA:  Lab Results  Component Value Date    WBC 6.9 05/28/2021   HGB 12.3 05/28/2021   HCT 36.1 05/28/2021   MCV 90.5 05/28/2021   PLT 217 05/28/2021   Lab Results  Component Value Date   NA 139 05/28/2021   K 4.3 05/28/2021   CL 104 05/28/2021   CO2 25 05/28/2021   Lab Results  Component Value Date   ALT 12 05/28/2021   AST 16 05/28/2021   ALKPHOS 95 05/28/2021   BILITOT 0.4 05/28/2021      RADIOGRAPHY: MR BREAST BILATERAL W WO CONTRAST INC CAD  Result Date: 05/23/2021 CLINICAL DATA:  Biopsy proven high-grade ductal carcinoma in-situ with necrosis and calcifications, focally suspicious for micro invasion in the upper-outer quadrant of the left breast. LABS:  None obtained on site. EXAM: BILATERAL BREAST MRI WITH AND WITHOUT CONTRAST TECHNIQUE: Multiplanar, multisequence MR images of both breasts were obtained prior to and following the intravenous administration of 8 ml of Gadavist Three-dimensional MR images were rendered by post-processing of the original MR data on an independent workstation. The three-dimensional MR images were interpreted, and findings are reported in the following complete MRI report for this study. Three dimensional images were evaluated at the independent interpreting workstation using the DynaCAD thin client. COMPARISON:  Previous exam(s). FINDINGS: Breast composition: d. Extreme fibroglandular tissue. Background parenchymal enhancement: Moderate Right breast: No mass or abnormal enhancement. Left breast: Post biopsy changes with surrounding enhancement is seen in the lateral aspect of the left breast (series 6, image 58). The post biopsy changes and enhancement measures 4.6 cm. Signal void artifact is seen at the biopsy site from the biopsy marker clip. Lymph nodes: In the left axilla there are approximately 7 lymph nodes. Some of these appear to have cortical thickening measuring up to 5 mm. Ancillary findings:  None. IMPRESSION: Post biopsy changes in enhancement in the lateral aspect of the left breast  measuring 4.6 cm. Prominent left axillary lymph nodes. RECOMMENDATION: Sonographic evaluation of the left axilla is recommended. If enlarged lymph nodes are visualized biopsy would be suggested. Treatment planning of the biopsy proven high-grade ductal carcinoma with calcifications and necrosis, focally suspicious for micro invasion is recommended. BI-RADS CATEGORY  4: Suspicious. Electronically Signed   By: Lillia Mountain M.D.   On: 05/23/2021 12:56  MM Digital Diagnostic Unilat L  Result Date: 05/09/2021 CLINICAL DATA:  50 year old female recalled from screening mammogram dated 04/22/2021 for left breast calcifications. EXAM: DIGITAL DIAGNOSTIC UNILATERAL LEFT MAMMOGRAM WITH CAD TECHNIQUE: Left digital diagnostic mammography was performed. Mammographic images were processed with CAD. COMPARISON:  Previous exam(s). ACR Breast Density Category d: The breast tissue is extremely dense, which lowers the sensitivity of mammography. FINDINGS: Grouped calcifications in the far upper outer quadrant of the left breast demonstrate a pleomorphic morphology. They span 1.8 x 1.8 x 1 cm. These have an indeterminate morphology. IMPRESSION: Indeterminate left breast calcifications spanning up to 1.8 cm. Recommendation is for stereotactic biopsy. RECOMMENDATION: Stereotactic biopsy of the left breast. I have  discussed the findings and recommendations with the patient. If applicable, a reminder letter will be sent to the patient regarding the next appointment. BI-RADS CATEGORY  4: Suspicious. Electronically Signed   By: Kristopher Oppenheim M.D.   On: 05/09/2021 11:37  [place] AXILLA LEFT  Result Date: 05/22/2021 CLINICAL DATA:  50 year old female recently diagnosed with high-grade DCIS with suspicion for focal micro invasion in the left breast. Ultrasound of the left axilla is requested to evaluate her lymph nodes. EXAM: ULTRASOUND OF THE LEFT AXILLA COMPARISON:  Previous exam(s). FINDINGS: Ultrasound of the left axilla demonstrates  multiple normal-appearing lymph nodes. IMPRESSION: No evidence of left axillary lymphadenopathy. RECOMMENDATION: Continued treatment plan. The patient is scheduled for breast MRI tomorrow. I have discussed the findings and recommendations with the patient. If applicable, a reminder letter will be sent to the patient regarding the next appointment. BI-RADS CATEGORY  1: Negative. Electronically Signed   By: Ammie Ferrier M.D.   On: 05/22/2021 13:01  MM CLIP PLACEMENT LEFT  Result Date: 05/16/2021 CLINICAL DATA:  Status post left breast stereotactic biopsy. EXAM: 3D DIAGNOSTIC LEFT MAMMOGRAM POST STEREOTACTIC BIOPSY COMPARISON:  Previous exam(s). FINDINGS: 3D Mammographic images were obtained following stereotactic guided biopsy of the left breast. The biopsy marking clip is in expected position at the site of biopsy. IMPRESSION: Appropriate positioning of the coil shaped biopsy marking clip at the site of biopsy in the upper outer left breast. Final Assessment: Post Procedure Mammograms for Marker Placement Electronically Signed   By: Kristopher Oppenheim M.D.   On: 05/16/2021 10:02  MM LT BREAST BX W LOC DEV 1ST LESION IMAGE BX SPEC STEREO GUIDE  Addendum Date: 05/20/2021   ADDENDUM REPORT: 05/20/2021 08:28 ADDENDUM: Pathology revealed HIGH-GRADE DUCTAL CARCINOMA IN SITU WITH NECROSIS AND CALCIFICATIONS, FOCALLY SUSPICIOUS FOR MICROINVASION of the LEFT breast, upper outer quadrant, posterior, (coil clip). This was found to be concordant by Dr. Kristopher Oppenheim. Pathology results were discussed with the patient by telephone. The patient reported doing well after the biopsy with tenderness at the site. Post biopsy instructions and care were reviewed and questions were answered. The patient was encouraged to call [facility] for any additional concerns. The patient was referred to [facility] on May 28, 2021.  Recommend breast MRI given breast density and high-grade histology. LEFT axillary ultrasound recommended given focal suspicion for microinvasion of disease. Request for scheduling both procedures sent via secure email as directed on May 19, 2021. Pathology results reported by Stacie Acres RN on 05/20/2021. Electronically Signed   By: Kristopher Oppenheim M.D.   On: 05/20/2021 08:28   Result Date: 05/20/2021 CLINICAL DATA:  50 year old female with indeterminate left breast calcifications. EXAM: LEFT BREAST STEREOTACTIC CORE NEEDLE BIOPSY COMPARISON:  Previous exams. FINDINGS: The patient and I discussed the procedure of stereotactic-guided biopsy including benefits and alternatives. We discussed the high likelihood of a successful procedure. We discussed the risks of the procedure including infection, bleeding, tissue injury, clip migration, and inadequate sampling. Informed written consent was given. The usual time out protocol was performed immediately prior to the procedure. Using sterile technique and 1% Lidocaine as local anesthetic, under stereotactic guidance, a 9 gauge vacuum assisted device was used to perform core needle biopsy of calcifications in the far posterior upper outer quadrant using a lateral approach. Specimen radiograph was performed showing calcifications within several specimens. Specimens with calcifications are identified for pathology. Lesion quadrant: Upper outer quadrant  At the conclusion of the procedure, a coil shaped tissue marker clip was deployed into the biopsy cavity. Follow-up 2-view mammogram was performed and dictated separately. IMPRESSION: Stereotactic-guided biopsy of the left breast. No apparent complications. Electronically Signed: By: Kristopher Oppenheim M.D. On: 05/16/2021 10:02      IMPRESSION/PLAN: 1. ER/PR positive high-grade DCIS cannot rule out microinvasive component of the left breast.Dr. Lisbeth Renshaw discusses the pathology findings and reviews the nature of left breast  disease. The consensus from the breast conference includes mastectomy versus lumpectomy. he patient is hoping to preserve her breast if possible, but is open to mastectomy. With hopes for breast conservation, Dr. Barry Dienes recommends MRI biopsies to determine extent of disease.  Dr. Lisbeth Renshaw discusses that if she does undergo mastectomy, that he would not anticipate a role for adjuvant radiotherapy.  If she did have breast conserving surgery however there would be a role and we discussed the risks, benefits, short, and long term effects of radiotherapy, as well as the curative intent, and the patient is interested in proceeding. Dr. Lisbeth Renshaw discusses the delivery and logistics of radiotherapy and anticipates a course of 4 or up 6 1/2 weeks of radiotherapy to the left breast with deep inspiration breath hold technique. We will see her back a few weeks after surgery to discuss the simulation process and anticipate we starting radiotherapy about 4-6 weeks after surgery.  2. Possible genetic predisposition to malignancy. The patient is a candidate for genetic testing given her personal history. She was offered referral and is interested and will meet genetic counseling today.    In a visit lasting 60 minutes, greater than 50% of the time was spent face to face reviewing her case, as well as in preparation of, discussing, and coordinating the patient's care.  The above documentation reflects my direct findings during this shared patient visit. Please see the separate note by Dr. Lisbeth Renshaw on this date for the remainder of the patient's plan of care.    Carola Rhine, [facility]    **Disclaimer: This note was dictated with voice recognition software. Similar sounding words can inadvertently be transcribed and this note may contain transcription errors which may not have been corrected upon publication of note.**

## 2021-05-28 NOTE — Assessment & Plan Note (Signed)
Screening mammogram detected left breast calcifications UOQ: 1.8 cm, stereotactic biopsy revealed high-grade DCIS with necrosis and calcifications with focal suspicious microinvasion, ER 95%, PR 5%, axilla negative, breast MRI revealed 4.6 cm area of non-mass enhancement, multiple axillary lymph nodes at least 7.  Pathology review: I discussed with the patient the difference between DCIS and invasive breast cancer. It is considered a precancerous lesion. DCIS is classified as a 0. It is generally detected through mammograms as calcifications. We discussed the significance of grades and its impact on prognosis. We also discussed the importance of ER and PR receptors and their implications to adjuvant treatment options. Prognosis of DCIS dependence on grade, comedo necrosis. It is anticipated that if not treated, 20-30% of DCIS can develop into invasive breast cancer.  Recommendation: 1. Breast conserving surgery versus mastectomy depending on MRI guided biopsy of the additional non-mass enhancement 2. Followed by adjuvant radiation therapy if she undergoes lumpectomy 3. Followed by antiestrogen therapy with tamoxifen 5 years  Tamoxifen counseling: We discussed the risks and benefits of tamoxifen. These include but not limited to insomnia, hot flashes, mood changes, vaginal dryness, and weight gain. Although rare, serious side effects including endometrial cancer, risk of blood clots were also discussed. We strongly believe that the benefits far outweigh the risks. Patient understands these risks and consented to starting treatment. Planned treatment duration is 5 years.  We will discuss with radiology about the axillary lymph nodes and whether or not anything further needs to be done.  Return to clinic after surgery to discuss the final pathology report and come up with an adjuvant treatment plan.

## 2021-05-29 ENCOUNTER — Encounter: Payer: Self-pay | Admitting: Genetic Counselor

## 2021-05-29 ENCOUNTER — Encounter: Payer: Self-pay | Admitting: *Deleted

## 2021-05-29 DIAGNOSIS — Z800 Family history of malignant neoplasm of digestive organs: Secondary | ICD-10-CM | POA: Insufficient documentation

## 2021-05-29 DIAGNOSIS — Z8052 Family history of malignant neoplasm of bladder: Secondary | ICD-10-CM | POA: Insufficient documentation

## 2021-05-29 NOTE — Progress Notes (Signed)
REFERRING PROVIDER: Nicholas Lose, MD [place],  [place] [id number]  PRIMARY PROVIDER:  Aretta Nip, MD  PRIMARY REASON FOR VISIT:  1. Ductal carcinoma in situ (DCIS) of left breast   2. Family history of cancer of gallbladder   3. Family history of bladder cancer      HISTORY OF PRESENT ILLNESS:   Sarah Vincent, a 49 y.o. female, was seen for a [place] cancer genetics consultation at the request of Dr. Lindi Adie due to a personal and family history of cancer.  Sarah Vincent presents to clinic today to discuss the possibility of a hereditary predisposition to cancer, genetic testing, and to further clarify her future cancer risks, as well as potential cancer risks for family members.   In July of 2022, at the age of 43, Sarah Vincent was diagnosed with ductal carcinoma in situ of the left breast. The tumor is ER and PR positive. The treatment plan includes surgery, radiation therapy if lumpectomy, and antiestrogen therapy.    CANCER HISTORY:  Oncology History  Ductal carcinoma in situ (DCIS) of left breast  05/20/2021 Initial Diagnosis   Screening mammogram: calcifications in the left breast and no malignancy in the right breast. Diagnostic mammogram and [place]: indeterminate left breast calcifications spanning up to 1.8 cm. Biopsy: high-grade DCIS with necrosis and calcifications, focally suspicious for microinvasion ER/PR+ (95%).    05/28/2021 Cancer Staging   Staging form: Breast, AJCC 8th Edition - Clinical stage from 05/28/2021: Stage 0 (cTis (DCIS), cN0, cM0, ER+, PR+) - Signed by Nicholas Lose, MD on 05/28/2021 Stage prefix: Initial diagnosis Nuclear grade: G3     RISK FACTORS:  Menarche was at age 38.  First live birth at age 57.  OCP use for approximately 12 years.  Ovaries intact: yes.  Hysterectomy: no.  Menopausal status: premenopausal.  HRT use: 0 years. Colonoscopy: yes;  around 2015 . Mammogram within the last year: yes.   Past  Medical History:  Diagnosis Date   Breast cancer [facility])    Family history of bladder cancer    Family history of cancer of gallbladder     No past surgical history on file.  Social History   Socioeconomic History   Marital status: Married    Spouse name: Not on file   Number of children: Not on file   Years of education: Not on file   Highest education level: Not on file  Occupational History   Not on file  Tobacco Use   Smoking status: Never   Smokeless tobacco: Never  Substance and Sexual Activity   Alcohol use: Yes    Comment: 1-2   Drug use: Not on file   Sexual activity: Not on file  Other Topics Concern   Not on file  Social History Narrative   Not on file   Social Determinants of Health   Financial Resource Strain: Not on file  Food Insecurity: Not on file  Transportation Needs: Not on file  Physical Activity: Not on file  Stress: Not on file  Social Connections: Not on file     FAMILY HISTORY:  We obtained a detailed, 4-generation family history.  Significant diagnoses are listed below: Family History  Problem Relation Age of Onset   Bladder Cancer Paternal Uncle 69       non-smoker   Dementia Maternal Grandmother    Stroke Maternal Grandfather    Liver cancer Paternal Grandmother 38   Cancer Paternal Grandmother 70  gallbladder cancer   Sarah Vincent has two sons (ages 81 and 40). She has one full-brother (age 16), one full-sister (age 11), and one paternal half-brother (age 93). None of these relatives have had cancer.  Sarah Vincent mother is alive at age 14 without cancer. There are three maternal aunts and one maternal uncle. There is no known cancer among maternal aunts/uncles or maternal cousins. Sarah Vincent maternal grandmother died at age 69 without cancer. Her maternal grandfather died at age 55 without cancer.   Sarah Vincent father is alive at age 75 without cancer. There were two paternal uncles. One uncle was recently  diagnosed with bladder cancer at age 48. There is no known cancer among paternal cousins. Sarah Vincent's paternal grandmother died at age 72 with gallbladder and liver cancer (diagonsed age 28). Her paternal grandfather died at age 110 without cancer.  Sarah Vincent is unaware of previous family history of genetic testing for hereditary cancer risks. Patient's maternal ancestors are of [place] descent, and paternal ancestors are of [place] and Native [place] descent. There is on reported Ashkenazi Jewish ancestry. There is no known consanguinity.  GENETIC COUNSELING ASSESSMENT: Sarah Vincent is a 50 y.o. female with a personal and family history of cancer which is somewhat suggestive of a hereditary cancer syndrome and predisposition to cancer. We, therefore, discussed and recommended the following at today's visit.   DISCUSSION: We discussed that approximately 5-10% of breast cancer is hereditary, with most cases associated with the BRCA1 and BRCA2 genes. There are other genes that can be associated with hereditary breast cancer syndromes. These include ATM, CHEK2, PALB2, etc. We discussed that testing is beneficial for several reasons, including knowing about other cancer risks, identifying potential screening and risk-reduction options that may be appropriate, and to understand if other family members could be at risk for cancer and allow them to undergo genetic testing.    We reviewed the characteristics, features and inheritance patterns of hereditary cancer syndromes. We also discussed genetic testing, including the appropriate family members to test, the process of testing, insurance coverage and turn-around-time for results. We discussed the implications of a negative, positive and/or variant of uncertain significant result. In order to get genetic test results in a timely manner so that Sarah Vincent can use these genetic test results for surgical decisions, we recommended Ms.  Vincent pursue genetic testing for the [employer]. Once complete, we recommend Sarah Vincent pursue reflex genetic testing to the CancerNext-Expanded + RNAinsight panel.   The BRCAplus panel offered by [employer] and includes sequencing and deletion/duplication analysis for the following 8 genes: ATM, BRCA1, BRCA2, CDH1, CHEK2, PALB2, PTEN, and TP53. The CancerNext-Expanded + RNAinsight gene panel offered by [employer] and includes sequencing and rearrangement analysis for the following 77 genes: AIP, ALK, APC, ATM, AXIN2, BAP1, BARD1, BLM, BMPR1A, BRCA1, BRCA2, BRIP1, CDC73, CDH1, CDK4, CDKN1B, CDKN2A, CHEK2, CTNNA1, DICER1, FANCC, FH, FLCN, GALNT12, KIF1B, LZTR1, MAX, MEN1, MET, MLH1, MSH2, MSH3, MSH6, MUTYH, NBN, NF1, NF2, NTHL1, PALB2, PHOX2B, PMS2, POT1, PRKAR1A, PTCH1, PTEN, RAD51C, RAD51D, RB1, RECQL, RET, SDHA, SDHAF2, SDHB, SDHC, SDHD, SMAD4, SMARCA4, SMARCB1, SMARCE1, STK11, SUFU, TMEM127, TP53, TSC1, TSC2, VHL and XRCC2 (sequencing and deletion/duplication); EGFR, EGLN1, HOXB13, KIT, MITF, PDGFRA, POLD1 and POLE (sequencing only); EPCAM and GREM1 (deletion/duplication only). RNA data is routinely analyzed for use in variant interpretation for all genes.  Based on Sarah Vincent's personal and family history of cancer, she meets medical criteria for genetic testing Print production planner of Breast  Surgeons Consensus Guideline on Genetic Testing for Hereditary Breast Cancer). Despite that she meets criteria, she may still have an out of pocket cost.   PLAN: After considering the risks, benefits, and limitations, Sarah Vincent provided informed consent to pursue genetic testing and the blood sample was sent to [facility] for analysis of the BRCAplus and CancerNext-Expanded + RNAinsight panels. Results should be available within approximately one-two weeks' time, at which point they will be disclosed by telephone to Sarah Vincent, as will any additional recommendations warranted by  these results. Ms. Shankle will receive a summary of her genetic counseling visit and a copy of her results once available. This information will also be available in Epic.   Ms. Quiett questions were answered to her satisfaction today. Our contact information was provided should additional questions or concerns arise. Thank you for the referral and allowing [place] to share in the care of your patient.   Clint Guy, [place], [facility] Licensed, Certified Dispensing [email or web address] Phone: [phone]  The patient was seen for a total of 20 minutes in face-to-face genetic counseling. Patient was seen with her husband. This patient was discussed with Drs. Magrinat, Lindi Adie and/or Burr Medico who agrees with the above.    _______________________________________________________________________ For Office Staff:  Number of people involved in session: 1 Was an Intern/ student involved with case: no

## 2021-06-02 ENCOUNTER — Telehealth: Payer: Self-pay | Admitting: Genetic Counselor

## 2021-06-02 ENCOUNTER — Encounter: Payer: Self-pay | Admitting: Genetic Counselor

## 2021-06-02 DIAGNOSIS — Z1379 Encounter for other screening for genetic and chromosomal anomalies: Secondary | ICD-10-CM | POA: Insufficient documentation

## 2021-06-02 NOTE — Telephone Encounter (Signed)
Revealed negative genetic testing through the [facility] panel. Discussed that we do not know why she has breast cancer or why there is cancer in the family. It is possible that there could be a mutation in a different gene that we are not testing, or our current technology may not be able to detect certain mutations. It will therefore be important for her to stay in contact with genetics to keep up with whether additional testing may be appropriate in the future.   Additional genetic testing through the [facility] CancerNext-Expanded + RNAinsight panel is pending. We will call her once these results are available.

## 2021-06-03 ENCOUNTER — Encounter: Payer: Self-pay | Admitting: *Deleted

## 2021-06-03 ENCOUNTER — Telehealth: Payer: Self-pay | Admitting: *Deleted

## 2021-06-03 NOTE — Telephone Encounter (Signed)
Spoke to pt concerning [place] from 05/28/21. Denies questions or concerns regarding dx or treatment care plan. Confirmed MR bx on 8/18  Scheduled to see Dr. Iran Planas on 06/04/21

## 2021-06-05 ENCOUNTER — Ambulatory Visit
Admission: RE | Admit: 2021-06-05 | Discharge: 2021-06-05 | Disposition: A | Discharge status: Home / Self Care | Payer: BC Managed Care – PPO | Source: Ambulatory Visit | Attending: General Surgery | Admitting: General Surgery

## 2021-06-05 ENCOUNTER — Other Ambulatory Visit: Payer: Self-pay

## 2021-06-05 ENCOUNTER — Other Ambulatory Visit (HOSPITAL_COMMUNITY): Payer: Self-pay | Admitting: Diagnostic Radiology

## 2021-06-05 DIAGNOSIS — D0512 Intraductal carcinoma in situ of left breast: Secondary | ICD-10-CM

## 2021-06-05 IMAGING — MG MM BREAST LOCALIZATION CLIP
5 series · 6 of 17 positions shown · non-contrast
Comparison: Previous exam(s).

CLINICAL DATA: Status post MR guided core needle biopsy of the mid
to lateral aspect of a 4.6 cm area enhancement in the posterior
upper outer quadrant of the left breast at the location of recently
biopsied calcifications demonstrating high-grade DCIS, focally
suspicious for microinvasion.

EXAM:
3D DIAGNOSTIC LEFT MAMMOGRAM POST MRI BIOPSY

[L ML synth-2D]
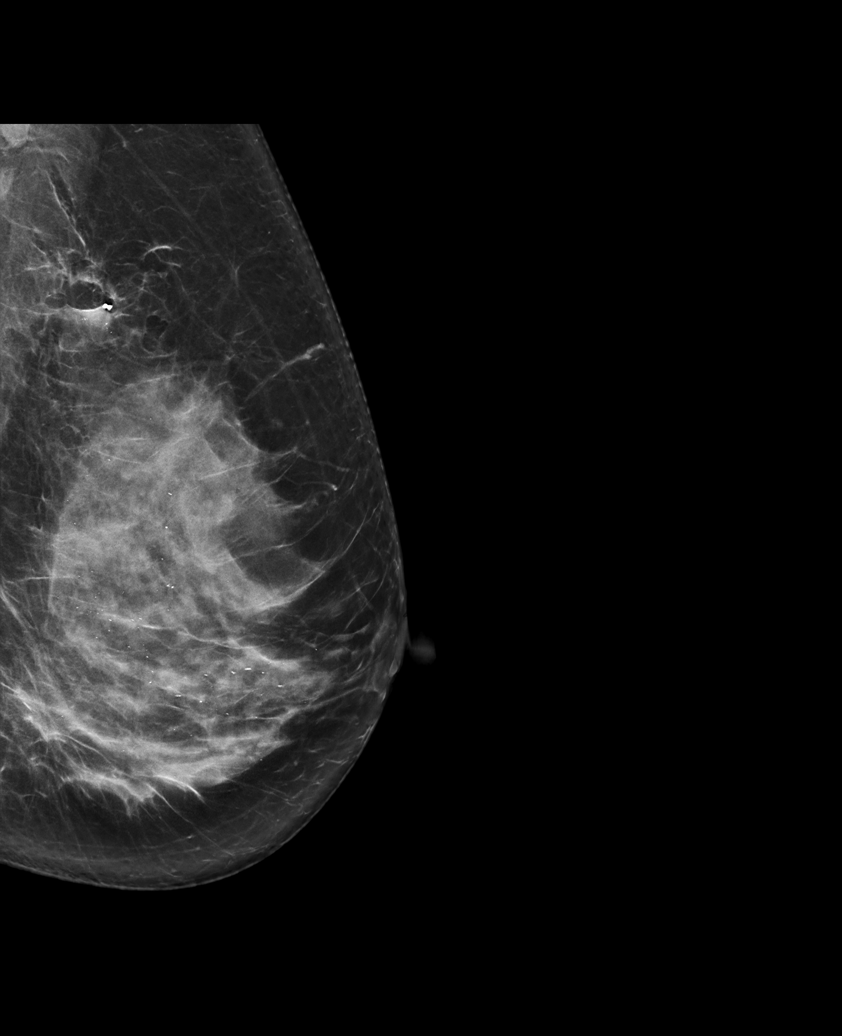

[L XCCL synth-2D]
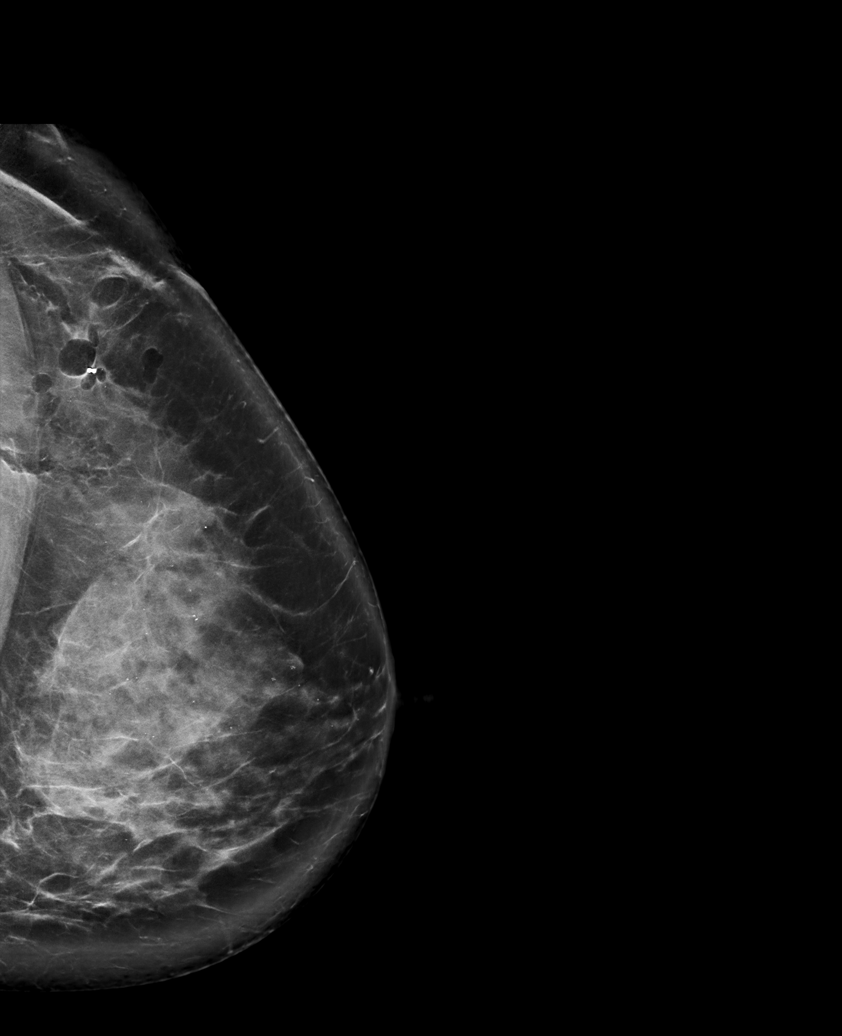

[L XCCL tomo · 2 of 99 frames shown]
[frame 32/99]
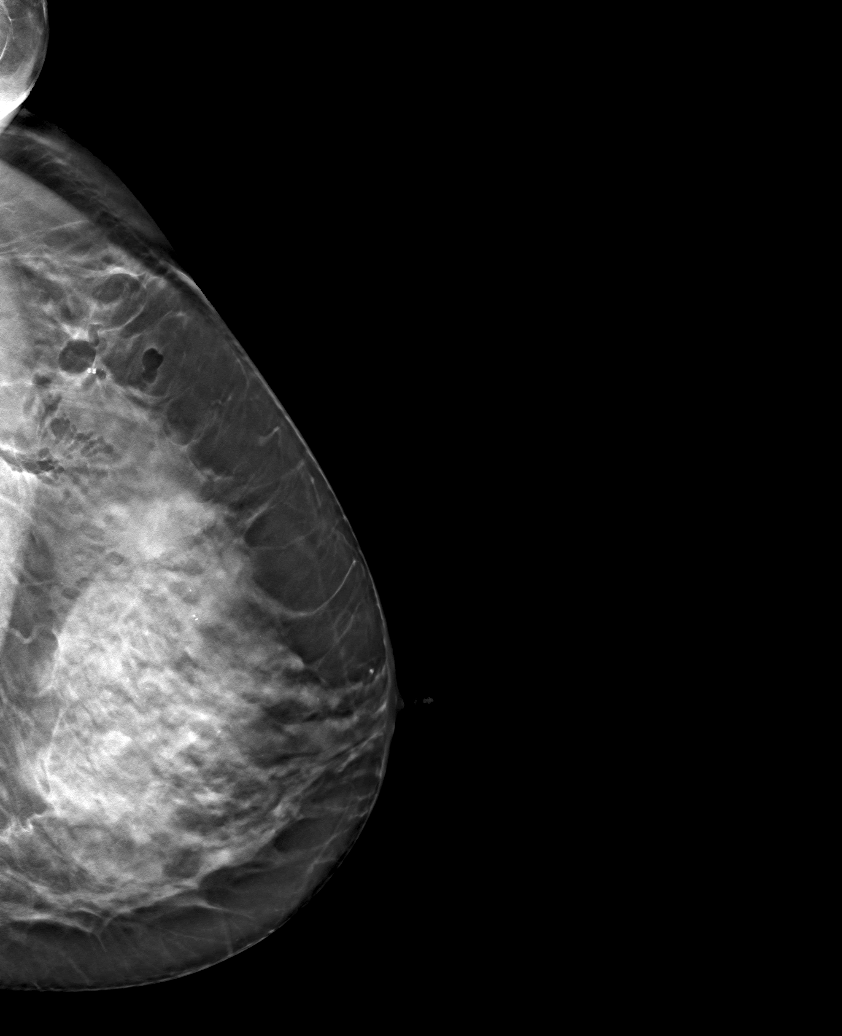
[frame 50/99]
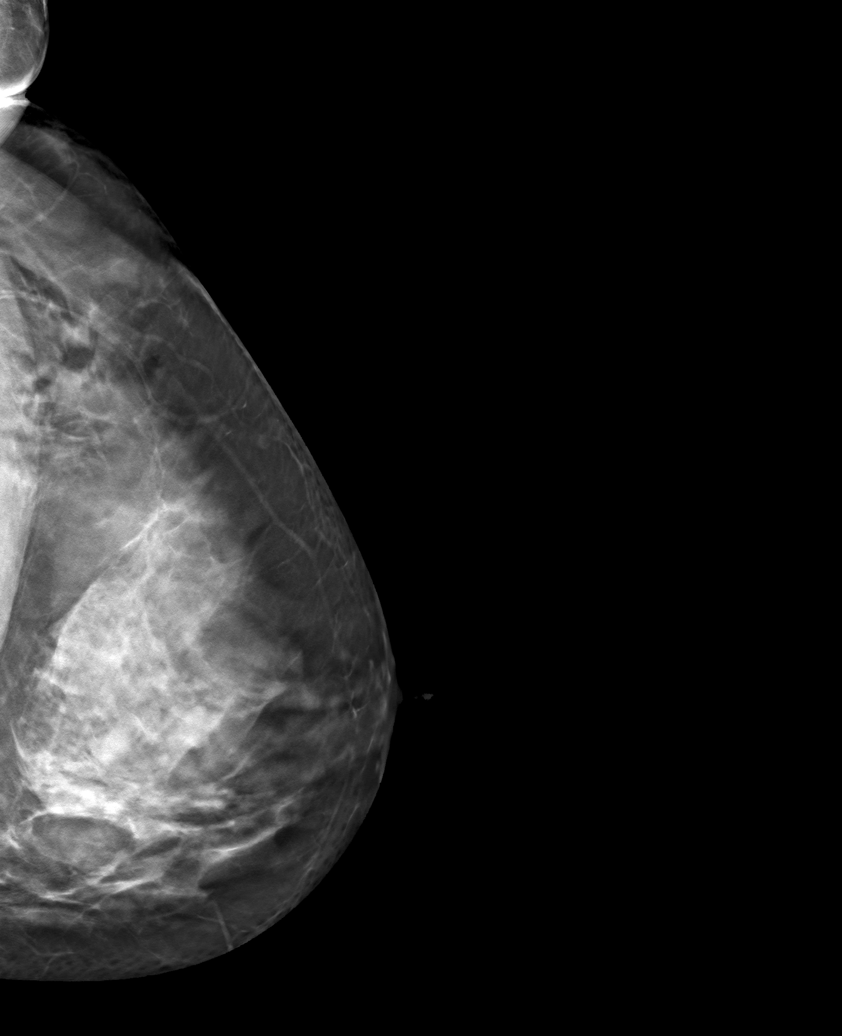

[L ML tomo · tomo slice 45/88.0]
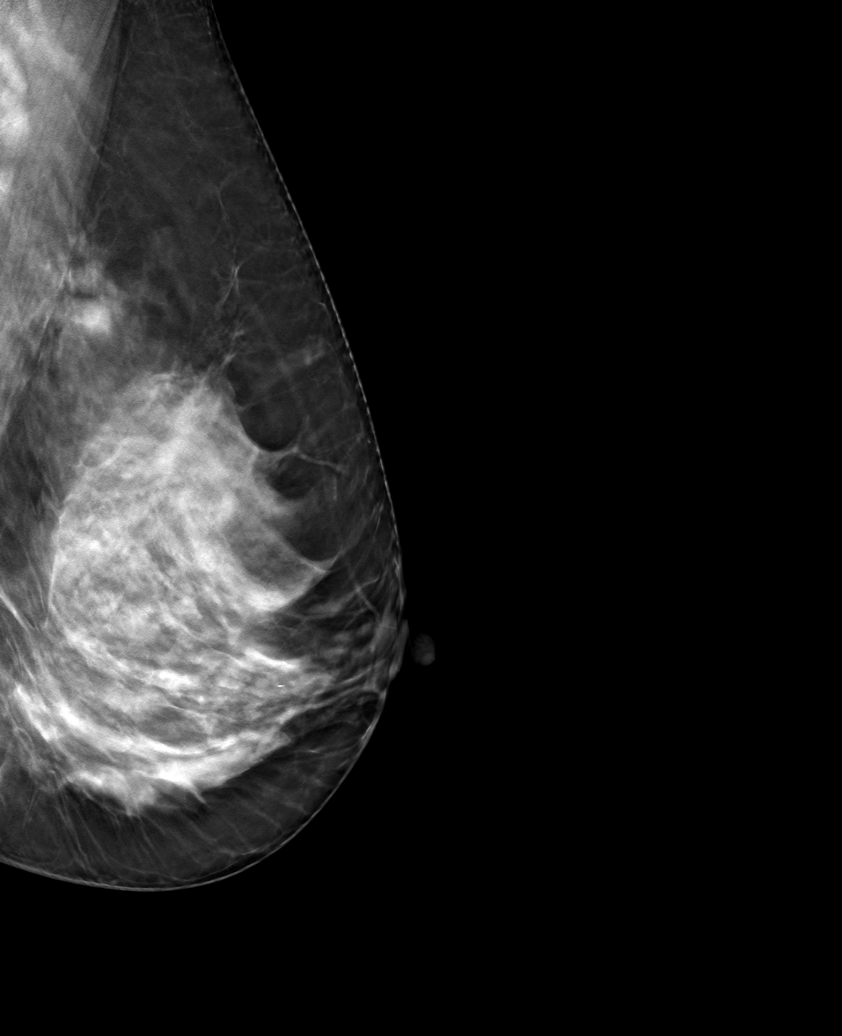

[L CC tomo · tomo slice 48/95.0]
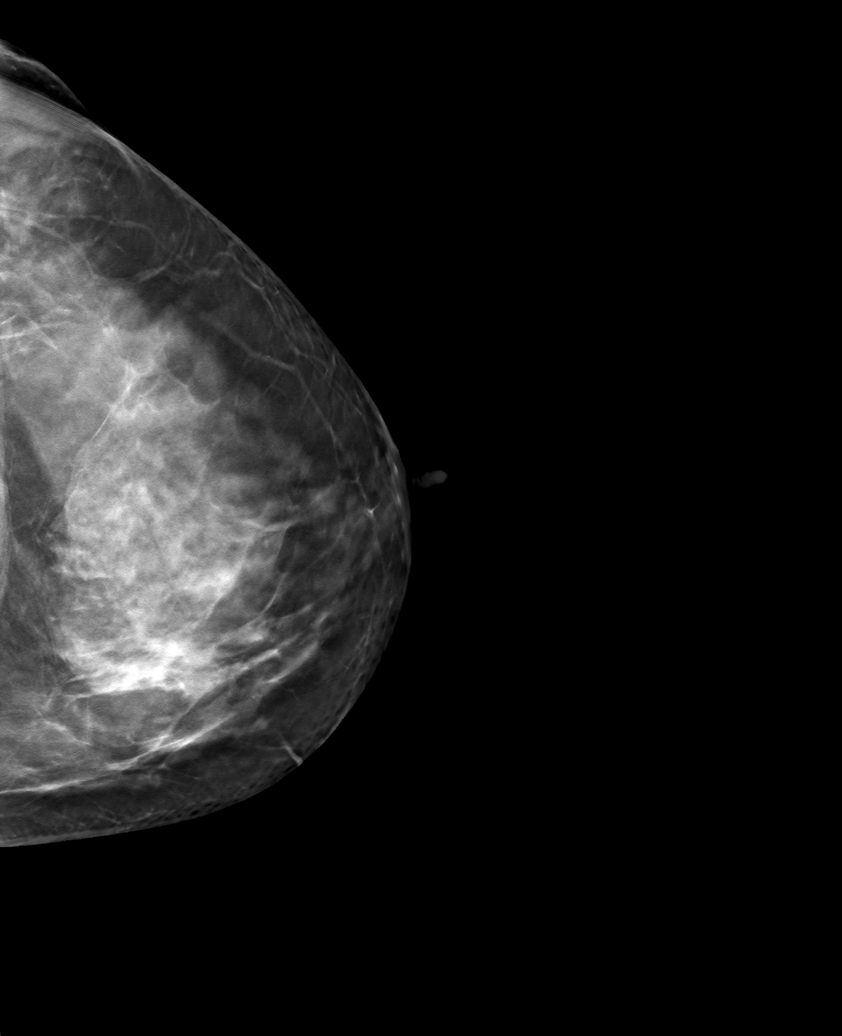

[6 of 17 positions shown; findings below may reference images not displayed]

FINDINGS: 3D Mammographic images were obtained following MR guided biopsy of
of the mid to lateral aspect of the recently demonstrated 4.6 cm
area of enhancement the posterior upper outer left breast. The
biopsy marking clip is in expected position at the site of biopsy.
The previously placed coil shaped biopsy marker clip is absent
following biopsy.
IMPRESSION: Appropriate positioning of the barbell shaped biopsy marking clip at
the site of biopsy in the posterior upper outer left breast.

Note: On today's MR images with the breast in lateral to medial
compression, the distance spanned by the residual enhancement and
biopsy marker clip artifact in the posterior upper outer left breast
measures 2.2 x 0.6 cm on MR image number 50/7. Based on that
appearance, the staging MR appearance, the current location of the
barbell shaped biopsy marker clip and location of residual
calcifications, the area of concern could be bracketed with a seed
placed 1.5 cm medial to the barbell clip and a 2nd seed placed
cm lateral to the barbell clip if lumpectomy is planned.

Final Assessment: Post Procedure Mammograms for Marker Placement

## 2021-06-05 IMAGING — MR MR BREAST BX W LOC DEV 1ST LESION IMAGE BX SPEC MR GUIDE*L*
8 of 12 series · 32 of 48 positions shown · IV contrast (8 ml gadavist)
Comparison: Previous exams.
COMPARISON: Previous exams.

Addendum:
CLINICAL DATA: Status post stereotactic guided core needle biopsy
of a 1.8 cm area of calcifications in the upper outer quadrant of
the left breast demonstrating high-grade ductal carcinoma in situ
with necrosis and calcifications, focally suspicious for micro
invasion. There was a 4.6 cm area of post biopsy changes and
enhancement at that location on a subsequent staging MRI of the
breasts. The biopsy marker clip artifact was within the medial
aspect of this area of enhancement on the staging MRI. MR guided
core needle biopsy of the 4.6 cm enhancing area was requested for
determining extent of disease.

EXAM:
MRI GUIDED CORE NEEDLE BIOPSY OF THE LEFT BREAST
TECHNIQUE: Multiplanar, multisequence MR imaging of the left breast was
performed both before and after administration of intravenous
contrast.
CONTRAST:  8mL GADAVIST GADOBUTROL 1 MMOL/ML IV SOLN

[Series 2: fiducial unilateral · sagittal · 2.0mm · 1.33mm/px · 3 of 52 slices shown]
[im 1/52]
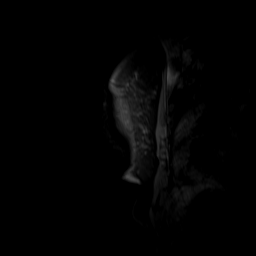
[im 26/52]
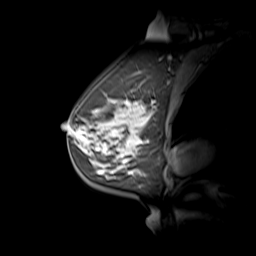
[im 52/52]
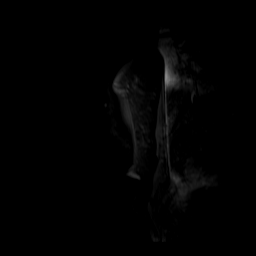

[Series 3: dynamic pre · axial · non-contrast · 1.3mm · 0.73mm/px · z∈[-62,+124]mm · 5 of 144 slices shown]
[im 1/144]
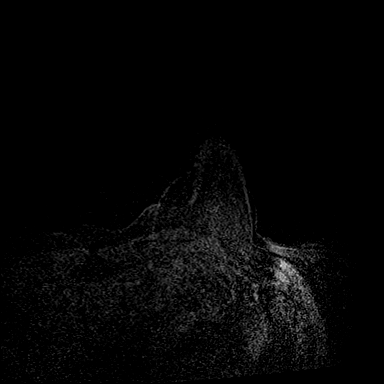
[im 36/144]
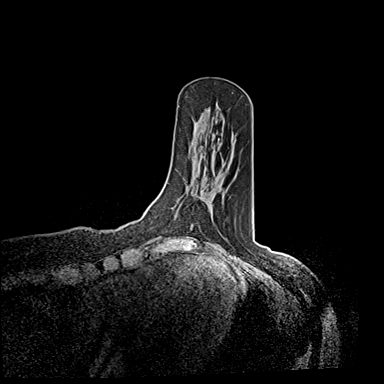
[im 72/144]
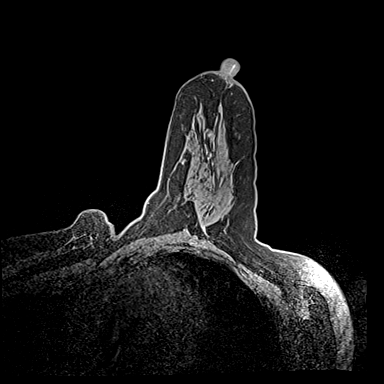
[im 108/144]
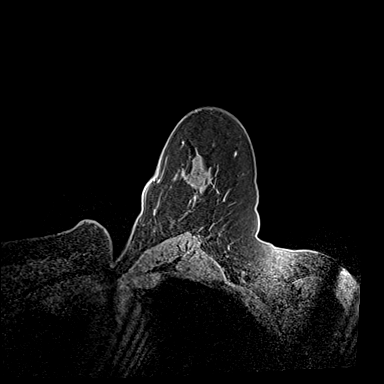
[im 144/144]
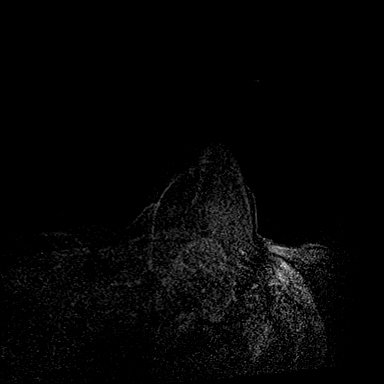

[Series 4: dynamic post 20 · axial · 1.3mm · 0.73mm/px · z∈[-62,+124]mm · 4 of 144 slices shown (1 of 2)]
[im 1/144]
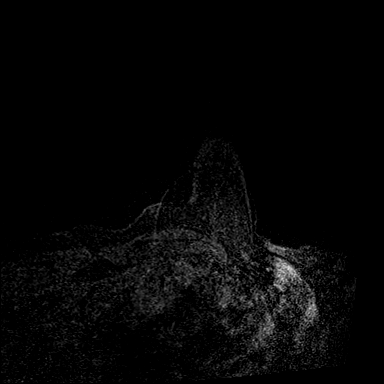
[im 48/144]
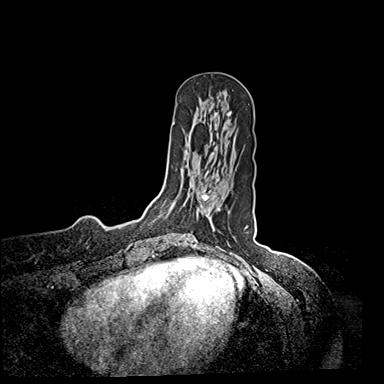
[im 96/144]
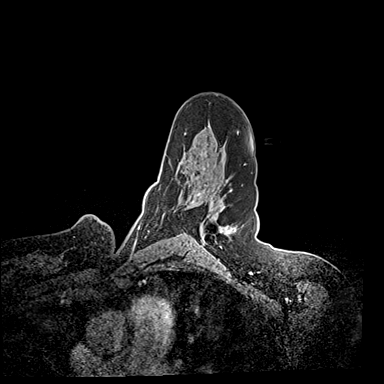
[im 144/144]
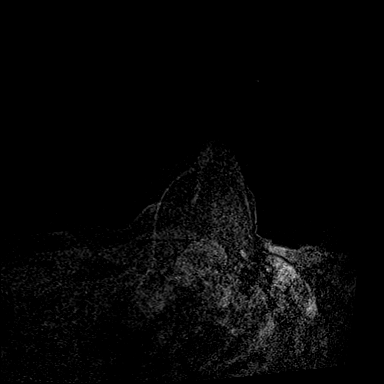

[Series 5: dynamic post 20 · axial · 1.3mm · 0.73mm/px · z∈[-62,+124]mm · 4 of 144 slices shown (2 of 2)]
[im 1/144]
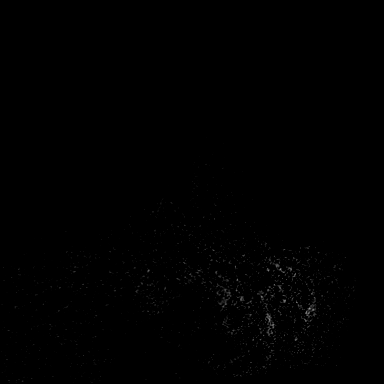
[im 48/144]
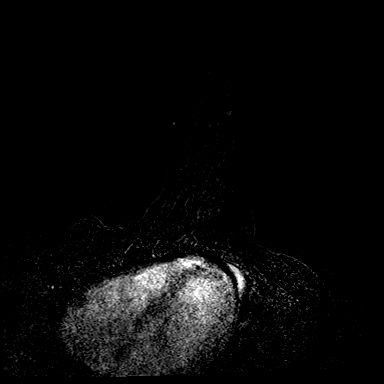
[im 96/144]
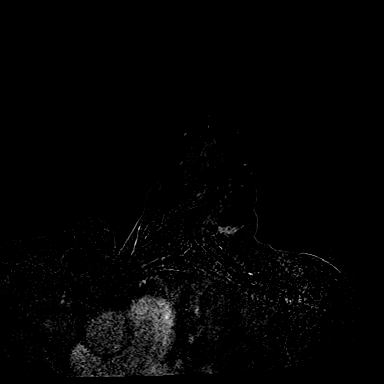
[im 144/144]
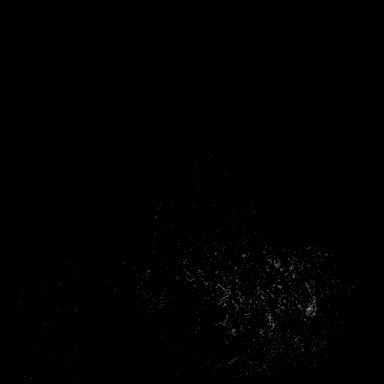

[Series 6: dynamic post 3 · axial · 1.3mm · 0.73mm/px · z∈[-62,+124]mm · 4 of 144 slices shown (1 of 2)]
[im 1/144]
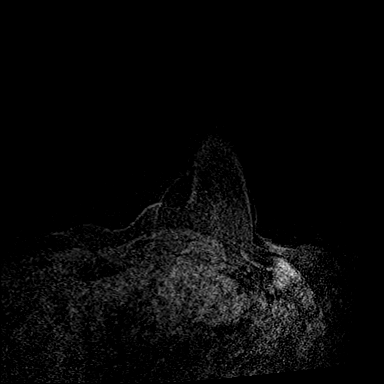
[im 48/144]
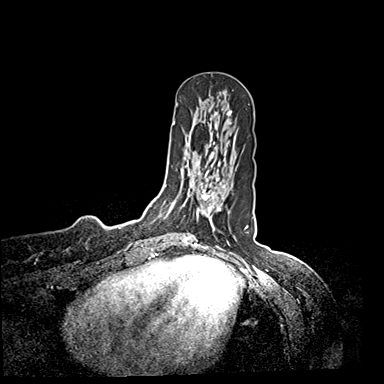
[im 96/144]
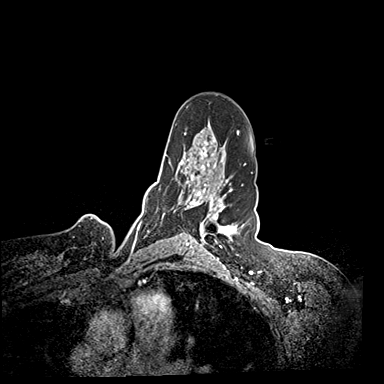
[im 144/144]
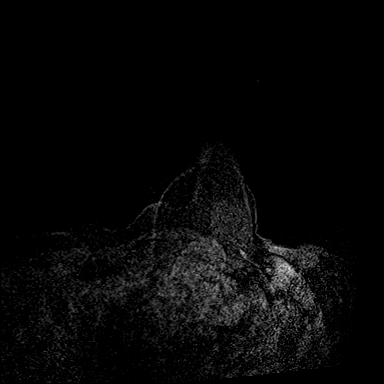

[Series 7: dynamic post 3 · axial · 1.3mm · 0.73mm/px · z∈[-62,+124]mm · 4 of 144 slices shown (2 of 2)]
[im 1/144]
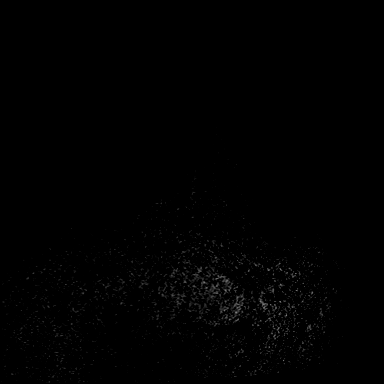
[im 48/144]
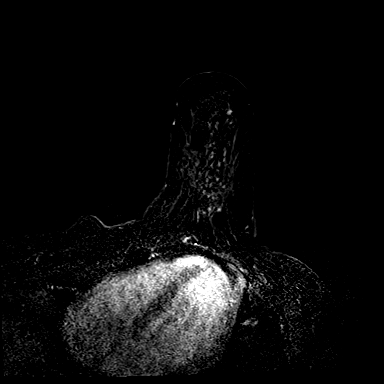
[im 96/144]
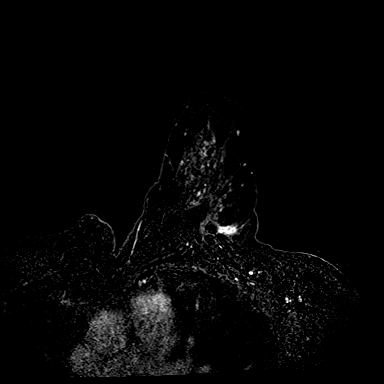
[im 144/144]
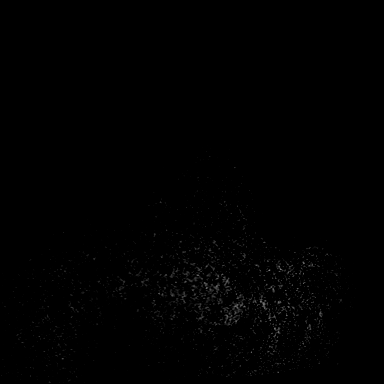

[Series 8: needle confirmation · axial · 1.3mm · 0.73mm/px · z∈[-62,+124]mm · 4 of 144 slices shown]
[im 1/144]
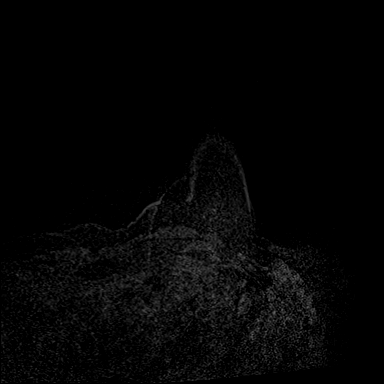
[im 48/144]
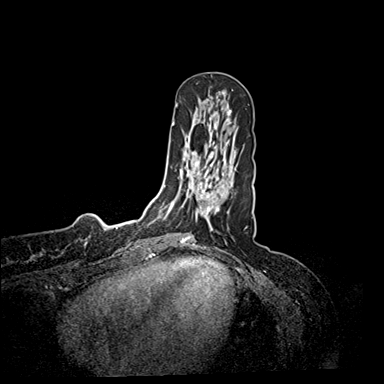
[im 96/144]
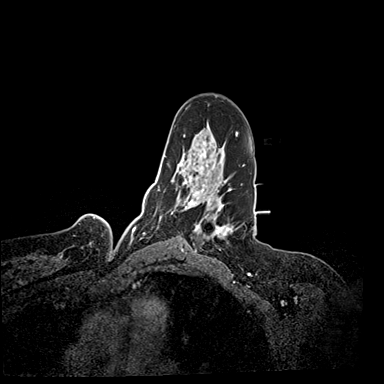
[im 144/144]
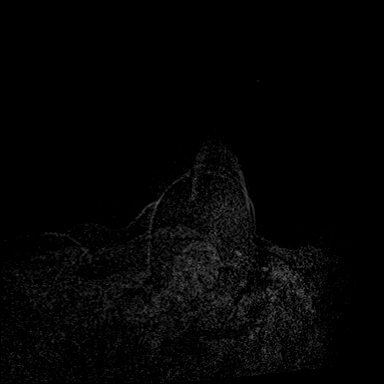

[Series 9: needle confirmation_sub · axial · 1.3mm · 0.73mm/px · z∈[-62,+124]mm · 4 of 144 slices shown]
[im 1/144]
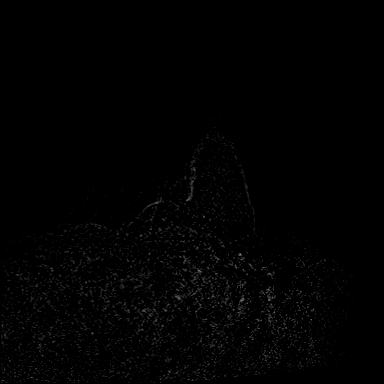
[im 48/144]
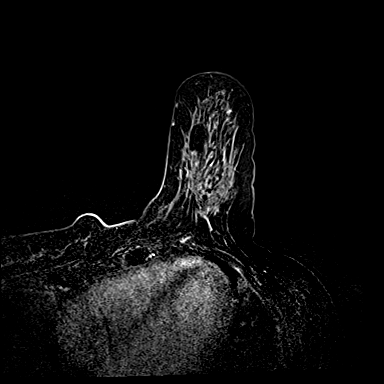
[im 96/144]
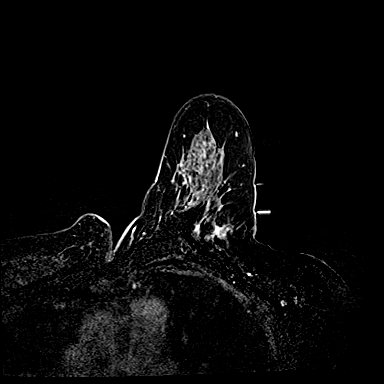
[im 144/144]
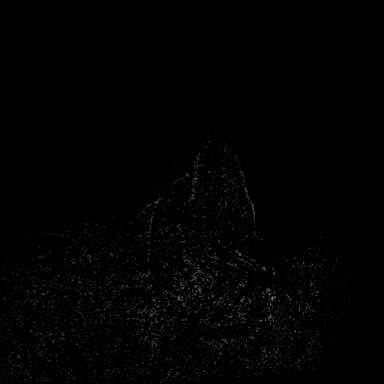

[32 of 48 positions shown; findings below may reference images not displayed]

FINDINGS: I met with the patient, and we discussed the procedure of MRI guided
biopsy, including risks, benefits, and alternatives. Specifically,
we discussed the risks of infection, bleeding, tissue injury, clip
migration, and inadequate sampling. Informed, written consent was
given. The usual time out protocol was performed immediately prior
to the procedure.

Using sterile technique, 1% Lidocaine, MRI guidance, and a 9 gauge
vacuum assisted device, biopsy was performed of the mid to lateral
aspect of the 4.6 cm area of enhancement in the posterior upper
outer left breast using a lateral approach. At the conclusion of the
procedure, a a barbell shaped tissue marker clip was deployed into
the biopsy cavity. Follow-up 2-view mammogram was performed and
dictated separately.
IMPRESSION: MRI guided biopsy of the mid to lateral aspect of the 4.6 cm area of
enhancement in the posterior upper outer left breast. No apparent
complications.

ADDENDUM:
Pathology revealed HIGH-GRADE DUCTAL CARCINOMA IN SITU WITH NECROSIS
AND CALCIFICATIONS of the LEFT breast, posterior, upper outer
quadrant, (barbell clip). This was found to be concordant by Dr.
BEJKO.

Pathology results were read on [REDACTED] MY CHART over weekend discussed
with the patient by telephone on [DATE]. The patient reported
doing well after the biopsy with tenderness at the site. Post biopsy
instructions and care were reviewed and questions were answered. The
patient was encouraged to call [REDACTED] for any additional concerns.

The patient has a recent diagnosis of left breast cancer and should
follow her outlined treatment plan.

Pathology results reported by BEJKO RN on [DATE].

*** End of Addendum ***
FINDINGS: I met with the patient, and we discussed the procedure of MRI guided
biopsy, including risks, benefits, and alternatives. Specifically,
we discussed the risks of infection, bleeding, tissue injury, clip
migration, and inadequate sampling. Informed, written consent was
given. The usual time out protocol was performed immediately prior
to the procedure.

Using sterile technique, 1% Lidocaine, MRI guidance, and a 9 gauge
vacuum assisted device, biopsy was performed of the mid to lateral
aspect of the 4.6 cm area of enhancement in the posterior upper
outer left breast using a lateral approach. At the conclusion of the
procedure, a a barbell shaped tissue marker clip was deployed into
the biopsy cavity. Follow-up 2-view mammogram was performed and
dictated separately.
IMPRESSION: MRI guided biopsy of the mid to lateral aspect of the 4.6 cm area of
enhancement in the posterior upper outer left breast. No apparent
complications.

## 2021-06-05 MED ORDER — GADOBUTROL 1 MMOL/ML IV SOLN
8.0000 mL | Freq: Once | INTRAVENOUS | Status: AC | PRN
  Administered 2021-06-05: 8 mL via INTRAVENOUS

## 2021-06-06 ENCOUNTER — Encounter: Payer: Self-pay | Admitting: *Deleted

## 2021-06-09 ENCOUNTER — Encounter: Payer: Self-pay | Admitting: General Practice

## 2021-06-09 ENCOUNTER — Encounter: Payer: Self-pay | Admitting: *Deleted

## 2021-06-09 NOTE — Progress Notes (Signed)
[place] Spiritual Care Note  Reached [name], who goes by "Sarah Vincent," by phone for follow-up support. She reports straddling the feelings of being blessed that she has a good prognosis, being stunned that she's dealing with a cancer diagnosis at all, being afraid of recurrence, and anticipating survivor guilt. Normalized the variety of sometimes conflicting feelings and reminded her of Breast Cancer Support Group for any point along her experience and of Finding Your New Normal [facility]) for adjustment to survivorship identity after treatment. 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 [name] verbalized appreciation for Spiritual Care and for whole team's supportive approach. She feels comfortable reaching out again as needed/desired for further support.   [place], [place], [place] Pager [phone] Voicemail [phone]

## 2021-06-12 ENCOUNTER — Telehealth: Payer: Self-pay | Admitting: *Deleted

## 2021-06-12 ENCOUNTER — Other Ambulatory Visit: Payer: Self-pay | Admitting: General Surgery

## 2021-06-12 DIAGNOSIS — C50412 Malignant neoplasm of upper-outer quadrant of left female breast: Secondary | ICD-10-CM

## 2021-06-12 DIAGNOSIS — Z170 Estrogen receptor positive status [ER+]: Secondary | ICD-10-CM

## 2021-06-12 NOTE — Telephone Encounter (Signed)
Spoke to pt regarding sx decision. Pt has decided to have bil mast w/ recon. Msg sent to physician team.

## 2021-06-16 ENCOUNTER — Other Ambulatory Visit: Payer: Self-pay | Admitting: *Deleted

## 2021-06-16 ENCOUNTER — Encounter: Payer: Self-pay | Admitting: *Deleted

## 2021-06-16 DIAGNOSIS — D0512 Intraductal carcinoma in situ of left breast: Secondary | ICD-10-CM

## 2021-06-17 ENCOUNTER — Encounter: Payer: Self-pay | Admitting: Genetic Counselor

## 2021-06-17 ENCOUNTER — Ambulatory Visit: Payer: Self-pay | Admitting: Genetic Counselor

## 2021-06-17 ENCOUNTER — Telehealth: Payer: Self-pay | Admitting: Genetic Counselor

## 2021-06-17 DIAGNOSIS — Z1379 Encounter for other screening for genetic and chromosomal anomalies: Secondary | ICD-10-CM

## 2021-06-17 DIAGNOSIS — Z1501 Genetic susceptibility to malignant neoplasm of breast: Secondary | ICD-10-CM

## 2021-06-17 NOTE — Progress Notes (Signed)
GENETIC TEST RESULTS   Patient Name: Sarah Vincent Patient Age: 50 y.o. Encounter Date: 06/17/2021  Referring Provider: Nicholas Lose, MD [address] [place],  [place] [id number]    [name] was seen in the [facility] clinic on 05/28/2021 due to a personal and family history of cancer and concern regarding a hereditary predisposition to cancer in the family. Please refer to the prior Genetics clinic note for more information regarding [name]'s medical and family histories and our assessment at the time.   FAMILY HISTORY:  We obtained a detailed, 4-generation family history.  Significant diagnoses are listed below: Family History  Problem Relation Age of Onset   Bladder Cancer Paternal Uncle 7       non-smoker   Dementia Maternal Grandmother    Stroke Maternal Grandfather    Liver cancer Paternal Grandmother 5   Cancer Paternal Grandmother 37       gallbladder cancer   [name] has two sons (ages 36 and 24). She has one full-brother (age 28), one full-sister (age 78), and one paternal half-brother (age 7). None of these relatives have had cancer.   [name] mother is alive at age 44 without cancer. There are three maternal aunts and one maternal uncle. There is no known cancer among maternal aunts/uncles or maternal cousins. [name] maternal grandmother died at age 31 without cancer. Her maternal grandfather died at age 19 without cancer.    [name] father is alive at age 53 without cancer. There were two paternal uncles. One uncle was recently diagnosed with bladder cancer at age 35. There is no known cancer among paternal cousins. [name]'s paternal grandmother died at age 78 with gallbladder and liver cancer (diagonsed age 46). Her paternal grandfather died at age 74 without cancer.   [name] is unaware of previous family history of genetic testing for hereditary cancer risks. Patient's maternal  ancestors are of [place] and [place] descent, and paternal ancestors are of [place] and Native American descent. There is on reported Ashkenazi Jewish ancestry. There is no known consanguinity.  GENETIC TESTING: Genetic testing reported on 06/10/2021 through the [facility] CancerNext-Expanded + RNAinsight panel offered by [employer]. 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  The CancerNext-Expanded + RNAinsight gene panel offered by [employer] and includes sequencing and rearrangement analysis for the following 77 genes: AIP, ALK, APC, ATM, AXIN2, BAP1, BARD1, BLM, BMPR1A, BRCA1, BRCA2, BRIP1, CDC73, CDH1, CDK4, CDKN1B, CDKN2A, CHEK2, CTNNA1, DICER1, FANCC, FH, FLCN, GALNT12, KIF1B, LZTR1, MAX, MEN1, MET, MLH1, MSH2, MSH3, MSH6, MUTYH, NBN, NF1, NF2, NTHL1, PALB2, PHOX2B, PMS2, POT1, PRKAR1A, PTCH1, PTEN, RAD51C, RAD51D, RB1, RECQL, RET, SDHA, SDHAF2, SDHB, SDHC, SDHD, SMAD4, SMARCA4, SMARCB1, SMARCE1, STK11, SUFU, TMEM127, TP53, TSC1, TSC2, VHL and XRCC2 (sequencing and deletion/duplication); EGFR, EGLN1, HOXB13, KIT, MITF, PDGFRA, POLD1 and POLE (sequencing only); EPCAM and GREM1 (deletion/duplication only). RNA data is routinely analyzed for use in variant interpretation for all genes. The test report will be scanned into EPIC and located under the Molecular Pathology section of the Results Review tab.  A portion of the result report is included below for reference.     BARD1 CANCER RISKS & RECOMMENDATIONS:  Pathogenic variants in the BARD1 gene are associated with an increased risk for female breast cancer, particularly triple negative breast cancer (TNBC). The Breast [place] case-control studies found an association between a BARD1 pathogenic variant and increased risk of TNBC (0.42%;  OR, 9.29; 95% CI, 4.58-18.85 and 0.41%; OR, 3.18; 95% CI, 1.16-7.42, respectively). Preliminary evidence suggests a  possible correlation with BARD1 and predisposition to other cancers, such as ovarian and neuroblastoma; however, the available evidence is insufficient to make a determination regarding these relationships.  Hereditary predisposition to cancer due to a pathogenic variant in the BARD1 gene has autosomal dominant inheritance. This means that only one pathogenic variant is needed to result in an increased risk for BARD1-associated cancer. Additionally, there is a 50% (1 in 2) chance for the children of an individual with a BARD1 pathogenic variant to also inherit the variant.  Breast Cancer Management:  The following is recommended for females with a BARD1 mutation, according to the NCCN Guidelines (Genetic/Familial High-Risk Assessment:  Breast, Ovarian, and Pancreatic, Version 2.2022):  Screening: Annual mammogram with consideration of tomosynthesis and consider breast MRI with contrast starting at age 87. Evidence is insufficient for risk-reducing mastectomy. Manage based on family history.  An individual's cancer risk and medical management are not determined by genetic test results alone. Overall cancer risk assessment incorporates additional factors, including personal medical history, family history, and any available genetic information that may result in a personalized plan for cancer prevention and surveillance.   Even though the data regarding specific cancer risk estimates with pathogenic BARD1 variants are limited, knowing if a pathogenic variant is present is advantageous. At-risk relatives can be identified, enabling pursuit of a diagnostic evaluation. Further, the available information regarding hereditary cancer susceptibility genes is constantly evolving and more clinically relevant data regarding BARD1 are likely to become available in the near future. Awareness of this cancer predisposition encourages patients and their providers to inform at-risk family members, to diligently follow  standard screening protocols, and to be vigilant in maintaining close and regular contact with their local genetics clinic in anticipation of new information.   FAMILY MEMBERS: It is important that all of [name]'s relatives (both men and women) know of the presence of this gene mutation. Site-specific genetic testing can sort out who in the family is at risk and who is not.    Ms. Canoy's first-degree relatives (children, full-siblings, and parents) each have a 50% (1 in 2) chance to have inherited this mutation. We do not test children because there is no risk to them until they are adults. We recommend family members have genetic counseling and testing by the time they are in their early 41s.   SUPPORT AND RESOURCES: If Ms. Stuckert is interested in BARD1-specific information and support, there are two groups, Facing Our Risk (www.facingourrisk.org) and Bright Pink (www.brightpink.org) which some people have found useful. They provide opportunities to speak with other individuals from high-risk families. To locate genetic counselors in other cities, visit the website www.FindAGeneticCounselor.com and search for a counselor by zip code.  We encouraged Ms. Parrales to remain in contact with [place] on an annual basis so we can update her personal and family histories, and let her know of advances in cancer genetics that may benefit the family. Our contact number was provided. Ms. Feldt questions were answered to her satisfaction today, and she knows she is welcome to call anytime with additional questions.   Clint Guy, [place], [facility] Licensed, Certified Dispensing [email or web address] phone: [phone]

## 2021-06-17 NOTE — Telephone Encounter (Signed)
Revealed positive genetic testing through the [facility] CancerNext-Expanded + RNAinsight panel. 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 Reviewed the known cancer risks associated with the BARD1 gene and management recommendations.

## 2021-06-17 NOTE — Telephone Encounter (Signed)
LVM that her additional genetic test results are available and requested that she call back to discuss them.

## 2021-06-18 ENCOUNTER — Ambulatory Visit: Payer: BC Managed Care – PPO | Attending: General Surgery

## 2021-06-18 ENCOUNTER — Other Ambulatory Visit: Payer: Self-pay

## 2021-06-18 DIAGNOSIS — D0512 Intraductal carcinoma in situ of left breast: Secondary | ICD-10-CM | POA: Diagnosis not present

## 2021-06-18 NOTE — Therapy (Signed)
[place], [place], [address] Phone: [phone]   Fax:  [phone]  Physical Therapy Evaluation  Patient Details  Name: Sarah Vincent([name]) MRN: [id number] Date of Birth: 21-May-1971 Referring Provider (PT): Dr, Barry Dienes   Encounter Date: 06/18/2021   PT End of Session - 06/18/21 1410     Visit Number 1    Number of Visits 2    Date for PT Re-Evaluation 09/10/21    PT Start Time 1306    PT Stop Time 1356    PT Time Calculation (min) 50 min    Activity Tolerance Patient tolerated treatment well    Behavior During Therapy [facility] for tasks assessed/performed             Past Medical History:  Diagnosis Date   Breast cancer ([place])    Family history of bladder cancer    Family history of cancer of gallbladder     History reviewed. No pertinent surgical history.  There were no vitals filed for this visit.    Subjective Assessment - 06/18/21 1312     Subjective Pre-surgical screen    Pertinent History Diagnosed with left breast Cancer  with yearly mammogram. Due to have bilateral mastectomies on 07/22/2021 with reconstruction. 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  Not sure yet what type of reconstruction she will have.    Patient Stated Goals gain info from provider, pre-surgical screen    Currently in Pain? No/denies    Multiple Pain Sites No                [facility] PT Assessment - 06/18/21 0001       Assessment   Medical Diagnosis Left Breast Cancer    Referring Provider (PT) Dr, Barry Dienes    Onset Date/Surgical Date 07/22/21    Hand Dominance Right    Prior Therapy no      Precautions   Precaution Comments active CA      Restrictions   Weight Bearing Restrictions No      Balance Screen   Has the patient fallen in the past 6 months No    Has the patient had a decrease in activity level because of a fear of falling?  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  residence    Living Arrangements Spouse/significant other;Children    Available Help at Discharge Family    Type of [place]      Prior Function   Level of Independence Independent    Vocation Full time employment    Vocation Requirements school psychologist    Leisure walk, run,      Cognition   Overall Cognitive Status Within Functional Limits for tasks assessed      Observation/Other Assessments   Observations WNL      Posture/Postural Control   Posture/Postural Control No significant limitations      AROM   Right Shoulder Extension 60 Degrees    Right Shoulder Flexion 163 Degrees    Right Shoulder ABduction 180 Degrees    Right Shoulder Internal Rotation 70 Degrees    Right Shoulder External Rotation 110 Degrees    Left Shoulder Extension 65 Degrees    Left Shoulder Flexion 162 Degrees    Left Shoulder ABduction 180 Degrees    Left Shoulder Internal Rotation 72 Degrees    Left Shoulder External Rotation 100 Degrees  LYMPHEDEMA/ONCOLOGY QUESTIONNAIRE - 06/18/21 0001       Type   Cancer Type left breast Cancer      Surgeries   Mastectomy Date 07/22/21      Treatment   Active Chemotherapy Treatment No    Past Chemotherapy Treatment No    Active Radiation Treatment No    Past Radiation Treatment No    Current Hormone Treatment No    Past Hormone Therapy No      What other symptoms do you have   Are you Having Heaviness or Tightness No    Are you having Pain No    Are you having pitting edema No    Is it Hard or Difficult finding clothes that fit No    Do you have infections No    Is there Decreased scar mobility No    Stemmer Sign No      Right Upper Extremity Lymphedema   10 cm Proximal to Olecranon Process 28 cm    Olecranon Process 24.5 cm    10 cm Proximal to Ulnar Styloid Process 20.5 cm    Just Proximal to Ulnar Styloid Process 15.1 cm    At Base of 2nd Digit 6.2 cm      Left Upper Extremity Lymphedema   10 cm Proximal to  Olecranon Process 28.8 cm    Olecranon Process 24.3 cm    10 cm Proximal to Ulnar Styloid Process 20 cm    Just Proximal to Ulnar Styloid Process 15.1 cm    At Base of 2nd Digit 6.2 cm             L-DEX FLOWSHEETS - 06/18/21 1400       L-DEX LYMPHEDEMA SCREENING   Measurement Type Unilateral    L-DEX MEASUREMENT EXTREMITY Upper Extremity    POSITION  Standing    DOMINANT SIDE Right    At Risk Side Left    BASELINE SCORE (UNILATERAL) 1.7                    Objective measurements completed on examination: See above findings.               PT Education - 06/18/21 1409     Education Details lymphedema, 4 post op exercises, SOZO screens,    Person(s) Educated Patient    Methods Explanation;Handout    Comprehension Verbalized understanding;Returned demonstration                 PT Long Term Goals - 06/18/21 1418       PT LONG TERM GOAL #1   Title Pts Bilateral shoulder ROM will return to Pre-surgery levels    Time 12    Period Weeks    Status New    Target Date 09/10/21             Breast Clinic Goals - 06/18/21 1417       Patient will be able to verbalize understanding of pertinent lymphedema risk reduction practices relevant to her diagnosis specifically related to skin care.   Time 1    Period Days    Status Achieved    Target Date 06/18/21      Patient will be able to return demonstrate and/or verbalize understanding of the post-op home exercise program related to regaining shoulder range of motion.   Time 1    Period Days    Status Achieved      Patient will be able to verbalize understanding of the  importance of attending the postoperative After Breast Cancer Class for further lymphedema risk reduction education and therapeutic exercise.   Time 1    Period Days    Status Achieved    Target Date 06/18/21                   Plan - 06/18/21 1412     Clinical Impression Statement Pt was seen for pre-surgery  screen.  She was educated in [place] op exercises to start after drains are removed and plastics have allowed ROM.  She was educated in skin care and precautions for lymphedema.  Circumference measures were taken as baseline.  She was advised to call if she has any questions in the interim    Stability/Clinical Decision Making Stable/Uncomplicated    Clinical Decision Making Low    Rehab Potential Excellent    PT Frequency 1x / week    PT Duration 12 weeks    PT Treatment/Interventions ADLs/Self Care Home Management;Therapeutic exercise;Patient/family education    PT Next Visit Plan Reassess after surgery, already set up for ABC and next SOZO screen    PT Home Exercise Plan 4 post op exercises to start after OK by MD    Recommended Other Services Sozo every 3 months,    Consulted and Agree with Plan of Care Patient           Patient will follow up at outpatient cancer rehab 3-4 weeks following surgery.  If the patient requires physical therapy at that time, a specific plan will be dictated and sent to the referring physician for approval. The patient was educated today on appropriate basic range of motion exercises to begin post operatively and the importance of attending the After Breast Cancer class following surgery.  Patient was educated today on lymphedema risk reduction practices as it pertains to recommendations that will benefit the patient immediately following surgery.  She verbalized good understanding.   The patient was assessed using the L-Dex machine today to produce a lymphedema index baseline score. The patient will be reassessed on a regular basis (typically every 3 months) to obtain new L-Dex scores. If the score is > 6.5 points away from his/her baseline score indicating onset of subclinical lymphedema, it will be recommended to wear a compression garment for 4 weeks, 12 hours per day and then be reassessed. If the score continues to be > 6.5 points from baseline at reassessment,  we will initiate lymphedema treatment. Assessing in this manner has a 95% rate of preventing clinically significant lymphedema.   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

## 2021-06-18 NOTE — Patient Instructions (Signed)
Physical Therapy Information for After Breast Cancer Surgery/Treatment:  Lymphedema is a swelling condition that you may be at risk for in your arm if you have lymph nodes removed from the armpit area.  After a sentinel node biopsy, the risk is approximately 5-9% and is higher after an axillary node dissection.  There is treatment available for this condition and it is not life-threatening.  Contact your physician or physical therapist with concerns. You may begin the 4 shoulder/posture exercises (see additional sheet) when permitted by your physician (typically a week after surgery).  If you have drains, you may need to wait until those are removed before beginning range of motion exercises.  A general recommendation is to not lift your arms above shoulder height until drains are removed.  These exercises should be done to your tolerance and gently.  This is not a "no pain/no gain" type of recovery so listen to your body and stretch into the range of motion that you can tolerate, stopping if you have pain.  If you are having immediate reconstruction, ask your plastic surgeon about doing exercises as he or she may want you to wait. We encourage you to attend the free one time ABC (After Breast Cancer) class offered by [facility].  You will learn information related to lymphedema risk, prevention and treatment and additional exercises to regain mobility following surgery.  You can call [phone] for more information.  This is offered the 1st and 3rd Monday of each month.  You only attend the class one time. While undergoing any medical procedure or treatment, try to avoid blood pressure being taken or needle sticks from occurring on the arm on the side of cancer.   This recommendation begins after surgery and continues for the rest of your life.  This may help reduce your risk of getting lymphedema (swelling in your arm). An excellent resource for those seeking information on  lymphedema is the National Lymphedema Network's web site. It can be accessed at [place].org If you notice swelling in your hand, arm or breast at any time following surgery (even if it is many years from now), please contact your doctor or physical therapist to discuss this.  Lymphedema can be treated at any time but it is easier for you if it is treated early on.  If you feel like your shoulder motion is not returning to normal in a reasonable amount of time, please contact your surgeon or physical therapist.  [facility]. [employer], [place], [place] [phone]; [address], [place], [place] [address] ABC CLASS After Breast Cancer Class  After Breast Cancer Class is a specially designed exercise class to assist you in a safe recover after having breast cancer surgery.  In this class you will learn how to get back to full function whether your drains were just removed or if you had surgery a month ago.  This one-time class is held the 1st and 3rd Monday of every month from 11:00 a.m. until 12:00 noon and is presently virtual  This class is FREE and space is limited. For more information or to register for the next available class, call [phone].  Class Goals  Understand specific stretches to improve the flexibility of you chest and shoulder. Learn ways to safely strengthen your upper body and improve your posture. Understand the warning signs of infection and why you may be at risk for an arm infection. Learn about Lymphedema and prevention.  ** You do not attend  this class until after surgery.  Drains must be removed to participate  Patient was instructed today in a home exercise program today for post op shoulder range of motion. These included active assist shoulder flexion in sitting, scapular retraction, wall walking with shoulder abduction, and hands behind head external rotation.  She was encouraged to do these twice a day, holding 3 seconds and repeating 5 times when permitted by her  physician.

## 2021-06-20 ENCOUNTER — Telehealth: Payer: Self-pay | Admitting: Hematology and Oncology

## 2021-06-20 NOTE — Telephone Encounter (Signed)
Scheduled appt per 8/29 sch msg. Pt aware.

## 2021-06-24 ENCOUNTER — Encounter: Payer: Self-pay | Admitting: *Deleted

## 2021-07-09 NOTE — H&P (Signed)
Subjective:     Patient ID: Sarah Vincent is a 50 y.o. female.     Here for follow up discussion breast reconstruction prior to planned bilateral mastectomies. Presented following screening MMG with left breast calcifications. Diagnostic MMG/US showed left breast calcifications spanning up to 1.8 cm. Biopsy showed high-grade DCIS with necrosis and calcifications, focally suspicious for microinvasion ER/PR+. [place] axilla negative.   MRI demonstrated post biopsy changes with surrounding enhancement in the lateral left breast measuring 4.6 cm. In the left axilla there were approximately 7 LN, some of which appear to have cortical thickening. Additional MR guided biopsy of enhancement left breast UOQ with high grade DCIS.   Genetics monoallelic mutation in [place].   Current 34 D. Wt up 20 lb over last year.   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   Lives with spouse and 2 teenage boys.   Review of Systems  Constitutional: Positive for fatigue and unexpected weight change.  Musculoskeletal: Positive for back pain.    Remainder 12 point review negative    Objective:   Physical Exam Cardiovascular:     Rate and Rhythm: Normal rate and regular rhythm.     Heart sounds: Normal heart sounds.  Pulmonary:     Effort: Pulmonary effort is normal.     Breath sounds: Normal breath sounds.  Lymphadenopathy:     Upper Body:     Right upper body: No axillary adenopathy.     Left upper body: No axillary adenopathy.  Skin:    Comments: Fitzpatrick 2     Breasts:  Grade 2 ptosis bilateral SN to nipple R 27 L 27 cm BW R 22 L 22 cm CW 16 cm Nipple to IMF R 12 L 12 ccm    Assessment:     DCIS left breast BARD1 mutation    Plan:     Patient has elected for bilateral mastectomies.   Reviewed SSM vs NSM, both will be asensate and not stimulate. Reviewed with both risks mastectomy flap necrosis requiring additional surgery. Given breast size recommend skin reduction  pattern. Reviewed anchor type scars.    Discussed use of acellular dermis in reconstruction, cadaveric source, incorporation over several weeks, risk that if has seroma or infection can act as additional nidus for infection if not incorporated.    Discussed prepectoral vs sub pectoral reconstruction. Discussed with patient and benefit of this is no animation deformity, may be less pain. Risk may be more visible rippling over upper poles, greater need of ADM. Reviewed pre pectoral would require larger amount acellular dermis, more drains. Discussed any type reconstruction also risks long term displacement implant and visible rippling. If prepectoral counseled I would recommend she be comfortable with silicone implants as more options that have less rippling. She agrees to prepectoral placement.   Reviewed reconstruction will be asensate and not stimulate. Reviewed additional risks including but not limited to risks mastectomy flap necrosis requiring additional surgery, seroma, hematoma, asymmetry, need to additional procedures, fat necrosis, DVT/PE, damage to adjacent structures, cardiopulmonary complications.   Second to [employer] provided. Drain teaching completed. Rx for Norco Robaxin and Bactrim given.

## 2021-07-15 ENCOUNTER — Other Ambulatory Visit: Payer: Self-pay

## 2021-07-15 ENCOUNTER — Encounter (HOSPITAL_BASED_OUTPATIENT_CLINIC_OR_DEPARTMENT_OTHER): Payer: Self-pay | Admitting: General Surgery

## 2021-07-18 ENCOUNTER — Encounter (HOSPITAL_BASED_OUTPATIENT_CLINIC_OR_DEPARTMENT_OTHER)
Admission: RE | Admit: 2021-07-18 | Discharge: 2021-07-18 | Disposition: A | Discharge status: Home / Self Care | Payer: BC Managed Care – PPO | Source: Ambulatory Visit | Attending: General Surgery | Admitting: General Surgery

## 2021-07-18 LAB — BASIC METABOLIC PANEL
Anion gap: 8 (ref 5–15)
BUN: 15 mg/dL (ref 6–20)
CO2: 27 mmol/L (ref 22–32)
Calcium: 9 mg/dL (ref 8.9–10.3)
Chloride: 103 mmol/L (ref 98–111)
Creatinine, Ser: 0.59 mg/dL (ref 0.44–1.00)
GFR, Estimated: >60 mL/min (ref >60)
Glucose, Bld: 96 mg/dL (ref 70–99)
Potassium: 4.5 mmol/L (ref 3.5–5.1)
Sodium: 138 mmol/L (ref 135–145)

## 2021-07-18 NOTE — Progress Notes (Signed)

## 2021-07-19 LAB — SARS CORONAVIRUS 2 (TAT 6-24 HRS): SARS Coronavirus 2: NEGATIVE

## 2021-07-22 ENCOUNTER — Encounter (HOSPITAL_BASED_OUTPATIENT_CLINIC_OR_DEPARTMENT_OTHER): Payer: Self-pay | Admitting: General Surgery

## 2021-07-22 ENCOUNTER — Ambulatory Visit (HOSPITAL_BASED_OUTPATIENT_CLINIC_OR_DEPARTMENT_OTHER)
Admission: RE | Admit: 2021-07-22 | Discharge: 2021-07-23 | Disposition: A | Discharge status: Home / Self Care | Payer: BC Managed Care – PPO | Attending: General Surgery | Admitting: General Surgery

## 2021-07-22 ENCOUNTER — Ambulatory Visit (HOSPITAL_BASED_OUTPATIENT_CLINIC_OR_DEPARTMENT_OTHER): Payer: BC Managed Care – PPO | Admitting: Certified Registered"

## 2021-07-22 ENCOUNTER — Ambulatory Visit (HOSPITAL_COMMUNITY)
Admission: RE | Admit: 2021-07-22 | Discharge: 2021-07-22 | Disposition: A | Discharge status: Home / Self Care | Payer: BC Managed Care – PPO | Source: Ambulatory Visit | Attending: General Surgery | Admitting: General Surgery

## 2021-07-22 ENCOUNTER — Encounter (HOSPITAL_BASED_OUTPATIENT_CLINIC_OR_DEPARTMENT_OTHER)
Admission: RE | Disposition: A | Discharge status: Home / Self Care | Payer: Self-pay | Source: Home / Self Care | Attending: General Surgery

## 2021-07-22 ENCOUNTER — Other Ambulatory Visit: Payer: Self-pay

## 2021-07-22 DIAGNOSIS — N6021 Fibroadenosis of right breast: Secondary | ICD-10-CM | POA: Insufficient documentation

## 2021-07-22 DIAGNOSIS — N6022 Fibroadenosis of left breast: Secondary | ICD-10-CM | POA: Diagnosis not present

## 2021-07-22 DIAGNOSIS — Z975 Presence of (intrauterine) contraceptive device: Secondary | ICD-10-CM | POA: Diagnosis not present

## 2021-07-22 DIAGNOSIS — Z880 Allergy status to penicillin: Secondary | ICD-10-CM | POA: Diagnosis not present

## 2021-07-22 DIAGNOSIS — C50412 Malignant neoplasm of upper-outer quadrant of left female breast: Secondary | ICD-10-CM

## 2021-07-22 DIAGNOSIS — N6081 Other benign mammary dysplasias of right breast: Secondary | ICD-10-CM | POA: Insufficient documentation

## 2021-07-22 DIAGNOSIS — Z20822 Contact with and (suspected) exposure to covid-19: Secondary | ICD-10-CM | POA: Diagnosis not present

## 2021-07-22 DIAGNOSIS — Z170 Estrogen receptor positive status [ER+]: Secondary | ICD-10-CM | POA: Insufficient documentation

## 2021-07-22 DIAGNOSIS — Z793 Long term (current) use of hormonal contraceptives: Secondary | ICD-10-CM | POA: Insufficient documentation

## 2021-07-22 DIAGNOSIS — Z79899 Other long term (current) drug therapy: Secondary | ICD-10-CM | POA: Insufficient documentation

## 2021-07-22 DIAGNOSIS — C50912 Malignant neoplasm of unspecified site of left female breast: Secondary | ICD-10-CM | POA: Diagnosis present

## 2021-07-22 HISTORY — PX: BREAST RECONSTRUCTION WITH PLACEMENT OF TISSUE EXPANDER AND ALLODERM: SHX6805

## 2021-07-22 HISTORY — DX: Acne, unspecified: L70.9

## 2021-07-22 HISTORY — PX: TOTAL MASTECTOMY: SHX6129

## 2021-07-22 HISTORY — DX: Anxiety disorder, unspecified: F41.9

## 2021-07-22 HISTORY — DX: Restless legs syndrome: G25.81

## 2021-07-22 HISTORY — DX: Depression, unspecified: F32.A

## 2021-07-22 LAB — POCT PREGNANCY, URINE: Preg Test, Ur: NEGATIVE

## 2021-07-22 SURGERY — MASTECTOMY, SIMPLE
Anesthesia: General | Site: Breast | Laterality: Bilateral

## 2021-07-22 MED ORDER — PROPOFOL 10 MG/ML IV BOLUS
INTRAVENOUS | Status: DC | PRN
  Administered 2021-07-22: 150 mg via INTRAVENOUS

## 2021-07-22 MED ORDER — KETOROLAC TROMETHAMINE 30 MG/ML IJ SOLN
30.0000 mg | Freq: Four times a day (QID) | INTRAMUSCULAR | Status: AC
  Administered 2021-07-22: 30 mg via INTRAVENOUS
  Filled 2021-07-22: qty 1

## 2021-07-22 MED ORDER — LIDOCAINE 2% (20 MG/ML) 5 ML SYRINGE
INTRAMUSCULAR | Status: AC
  Filled 2021-07-22: qty 5

## 2021-07-22 MED ORDER — PROCHLORPERAZINE MALEATE 10 MG PO TABS
10.0000 mg | ORAL_TABLET | Freq: Four times a day (QID) | ORAL | Status: DC | PRN
  Filled 2021-07-22: qty 1

## 2021-07-22 MED ORDER — FENTANYL CITRATE (PF) 100 MCG/2ML IJ SOLN
INTRAMUSCULAR | Status: DC | PRN
  Administered 2021-07-22 (×2): 50 ug via INTRAVENOUS

## 2021-07-22 MED ORDER — CHLORHEXIDINE GLUCONATE CLOTH 2 % EX PADS
6.0000 | MEDICATED_PAD | Freq: Once | CUTANEOUS | Status: DC
Start: 2021-07-22 — End: 2021-07-22

## 2021-07-22 MED ORDER — SODIUM CHLORIDE 0.9 % IV SOLN
INTRAVENOUS | Status: AC
  Filled 2021-07-22: qty 10

## 2021-07-22 MED ORDER — GABAPENTIN 100 MG PO CAPS
ORAL_CAPSULE | ORAL | Status: AC
  Filled 2021-07-22: qty 1

## 2021-07-22 MED ORDER — KETOROLAC TROMETHAMINE 30 MG/ML IJ SOLN
30.0000 mg | Freq: Four times a day (QID) | INTRAMUSCULAR | Status: DC | PRN
  Filled 2021-07-22: qty 1

## 2021-07-22 MED ORDER — POVIDONE-IODINE 10 % EX SOLN
CUTANEOUS | Status: DC | PRN
  Administered 2021-07-22: 1 via TOPICAL

## 2021-07-22 MED ORDER — DEXAMETHASONE SODIUM PHOSPHATE 10 MG/ML IJ SOLN
INTRAMUSCULAR | Status: AC
  Filled 2021-07-22: qty 1

## 2021-07-22 MED ORDER — ACETAMINOPHEN 500 MG PO TABS
ORAL_TABLET | ORAL | Status: AC
  Filled 2021-07-22: qty 2

## 2021-07-22 MED ORDER — SODIUM CHLORIDE 0.9 % IV SOLN
INTRAVENOUS | Status: DC | PRN
  Administered 2021-07-22: 500 mL

## 2021-07-22 MED ORDER — FENTANYL CITRATE (PF) 100 MCG/2ML IJ SOLN
100.0000 ug | Freq: Once | INTRAMUSCULAR | Status: AC
  Administered 2021-07-22: 100 ug via INTRAVENOUS

## 2021-07-22 MED ORDER — ROPIVACAINE HCL 5 MG/ML IJ SOLN
INTRAMUSCULAR | Status: DC | PRN
  Administered 2021-07-22: 40 mL via PERINEURAL

## 2021-07-22 MED ORDER — MIDAZOLAM HCL 2 MG/2ML IJ SOLN
2.0000 mg | Freq: Once | INTRAMUSCULAR | Status: AC
  Administered 2021-07-22: 2 mg via INTRAVENOUS

## 2021-07-22 MED ORDER — MEPERIDINE HCL 25 MG/ML IJ SOLN: 6.2500 mg | INTRAMUSCULAR | Status: DC | PRN

## 2021-07-22 MED ORDER — CHLORHEXIDINE GLUCONATE CLOTH 2 % EX PADS: 6.0000 | MEDICATED_PAD | Freq: Once | CUTANEOUS | Status: DC

## 2021-07-22 MED ORDER — HYDROMORPHONE HCL 1 MG/ML IJ SOLN
0.2500 mg | INTRAMUSCULAR | Status: DC | PRN
  Administered 2021-07-22: 0.5 mg via INTRAVENOUS

## 2021-07-22 MED ORDER — LACTATED RINGERS IV SOLN: INTRAVENOUS | Status: DC

## 2021-07-22 MED ORDER — DEXAMETHASONE SODIUM PHOSPHATE 4 MG/ML IJ SOLN
INTRAMUSCULAR | Status: DC | PRN
Start: 2021-07-22 — End: 2021-07-22
  Administered 2021-07-22: 10 mg via INTRAVENOUS

## 2021-07-22 MED ORDER — CIPROFLOXACIN IN D5W 400 MG/200ML IV SOLN
400.0000 mg | INTRAVENOUS | Status: AC
  Administered 2021-07-22: 400 mg via INTRAVENOUS

## 2021-07-22 MED ORDER — ROCURONIUM BROMIDE 100 MG/10ML IV SOLN
INTRAVENOUS | Status: DC | PRN
  Administered 2021-07-22: 80 mg via INTRAVENOUS

## 2021-07-22 MED ORDER — OXYCODONE HCL 5 MG PO TABS: 5.0000 mg | ORAL_TABLET | ORAL | Status: DC | PRN

## 2021-07-22 MED ORDER — ONDANSETRON HCL 4 MG/2ML IJ SOLN: 4.0000 mg | Freq: Four times a day (QID) | INTRAMUSCULAR | Status: DC | PRN

## 2021-07-22 MED ORDER — ONDANSETRON HCL 4 MG/2ML IJ SOLN
INTRAMUSCULAR | Status: DC | PRN
  Administered 2021-07-22: 4 mg via INTRAVENOUS

## 2021-07-22 MED ORDER — AMISULPRIDE (ANTIEMETIC) 5 MG/2ML IV SOLN: 10.0000 mg | Freq: Once | INTRAVENOUS | Status: DC | PRN

## 2021-07-22 MED ORDER — PROPOFOL 500 MG/50ML IV EMUL
INTRAVENOUS | Status: AC
  Filled 2021-07-22: qty 100

## 2021-07-22 MED ORDER — EPHEDRINE 5 MG/ML INJ
INTRAVENOUS | Status: AC
  Filled 2021-07-22: qty 5

## 2021-07-22 MED ORDER — ROCURONIUM BROMIDE 10 MG/ML (PF) SYRINGE
PREFILLED_SYRINGE | INTRAVENOUS | Status: AC
  Filled 2021-07-22: qty 10

## 2021-07-22 MED ORDER — GABAPENTIN 300 MG PO CAPS
ORAL_CAPSULE | ORAL | Status: AC
  Filled 2021-07-22: qty 1

## 2021-07-22 MED ORDER — LIDOCAINE HCL (CARDIAC) PF 100 MG/5ML IV SOSY
PREFILLED_SYRINGE | INTRAVENOUS | Status: DC | PRN
  Administered 2021-07-22: 80 mg via INTRAVENOUS

## 2021-07-22 MED ORDER — CIPROFLOXACIN IN D5W 400 MG/200ML IV SOLN
INTRAVENOUS | Status: AC
  Filled 2021-07-22: qty 200

## 2021-07-22 MED ORDER — GABAPENTIN 100 MG PO CAPS
100.0000 mg | ORAL_CAPSULE | Freq: Two times a day (BID) | ORAL | Status: DC
  Administered 2021-07-22: 100 mg via ORAL

## 2021-07-22 MED ORDER — PROCHLORPERAZINE EDISYLATE 10 MG/2ML IJ SOLN: 5.0000 mg | Freq: Four times a day (QID) | INTRAMUSCULAR | Status: DC | PRN

## 2021-07-22 MED ORDER — FENTANYL CITRATE (PF) 100 MCG/2ML IJ SOLN
INTRAMUSCULAR | Status: AC
  Filled 2021-07-22: qty 2

## 2021-07-22 MED ORDER — PROPOFOL 500 MG/50ML IV EMUL
INTRAVENOUS | Status: DC | PRN
Start: 2021-07-22 — End: 2021-07-22
  Administered 2021-07-22: 25 ug/kg/min via INTRAVENOUS

## 2021-07-22 MED ORDER — TECHNETIUM TC 99M TILMANOCEPT KIT
1.0000 | PACK | Freq: Once | INTRAVENOUS | Status: AC | PRN
  Administered 2021-07-22: 1 via INTRADERMAL

## 2021-07-22 MED ORDER — PROPOFOL 500 MG/50ML IV EMUL
INTRAVENOUS | Status: AC
  Filled 2021-07-22: qty 50

## 2021-07-22 MED ORDER — ACETAMINOPHEN 500 MG PO TABS
1000.0000 mg | ORAL_TABLET | ORAL | Status: AC
  Administered 2021-07-22: 1000 mg via ORAL

## 2021-07-22 MED ORDER — MAGTRACE LYMPHATIC TRACER
INTRAMUSCULAR | Status: DC | PRN
  Administered 2021-07-22: 2 mL via INTRAMUSCULAR

## 2021-07-22 MED ORDER — SERTRALINE HCL 100 MG PO TABS
100.0000 mg | ORAL_TABLET | Freq: Every day | ORAL | Status: DC
  Filled 2021-07-22: qty 1

## 2021-07-22 MED ORDER — SPIRONOLACTONE 25 MG PO TABS
25.0000 mg | ORAL_TABLET | Freq: Two times a day (BID) | ORAL | Status: DC
  Filled 2021-07-22: qty 1

## 2021-07-22 MED ORDER — OXYCODONE HCL 5 MG PO TABS: 5.0000 mg | ORAL_TABLET | Freq: Once | ORAL | Status: DC | PRN

## 2021-07-22 MED ORDER — SENNA 8.6 MG PO TABS: 1.0000 | ORAL_TABLET | Freq: Two times a day (BID) | ORAL | Status: DC

## 2021-07-22 MED ORDER — DEXTROSE IN LACTATED RINGERS 5 % IV SOLN
INTRAVENOUS | Status: AC
  Administered 2021-07-22: 50 mL/h via INTRAVENOUS

## 2021-07-22 MED ORDER — GABAPENTIN 300 MG PO CAPS
300.0000 mg | ORAL_CAPSULE | ORAL | Status: AC
  Administered 2021-07-22: 300 mg via ORAL

## 2021-07-22 MED ORDER — DIPHENHYDRAMINE HCL 50 MG/ML IJ SOLN: 12.5000 mg | Freq: Four times a day (QID) | INTRAMUSCULAR | Status: DC | PRN

## 2021-07-22 MED ORDER — METHOCARBAMOL 500 MG PO TABS: 500.0000 mg | ORAL_TABLET | Freq: Four times a day (QID) | ORAL | Status: DC | PRN

## 2021-07-22 MED ORDER — ROPINIROLE HCL 0.5 MG PO TABS
0.5000 mg | ORAL_TABLET | Freq: Every day | ORAL | Status: DC
  Filled 2021-07-22: qty 1

## 2021-07-22 MED ORDER — PROMETHAZINE HCL 25 MG/ML IJ SOLN: 6.2500 mg | INTRAMUSCULAR | Status: DC | PRN

## 2021-07-22 MED ORDER — ONDANSETRON 4 MG PO TBDP: 4.0000 mg | ORAL_TABLET | Freq: Four times a day (QID) | ORAL | Status: DC | PRN

## 2021-07-22 MED ORDER — MORPHINE SULFATE (PF) 4 MG/ML IV SOLN: 1.0000 mg | INTRAVENOUS | Status: DC | PRN

## 2021-07-22 MED ORDER — DIPHENHYDRAMINE HCL 12.5 MG/5ML PO ELIX: 12.5000 mg | ORAL_SOLUTION | Freq: Four times a day (QID) | ORAL | Status: DC | PRN

## 2021-07-22 MED ORDER — MELATONIN 3 MG PO TABS
3.0000 mg | ORAL_TABLET | Freq: Every evening | ORAL | Status: DC | PRN
  Filled 2021-07-22: qty 1

## 2021-07-22 MED ORDER — CIPROFLOXACIN IN D5W 400 MG/200ML IV SOLN
400.0000 mg | Freq: Two times a day (BID) | INTRAVENOUS | Status: AC
  Administered 2021-07-22: 400 mg via INTRAVENOUS
  Filled 2021-07-22: qty 200

## 2021-07-22 MED ORDER — OXYCODONE HCL 5 MG/5ML PO SOLN: 5.0000 mg | Freq: Once | ORAL | Status: DC | PRN

## 2021-07-22 MED ORDER — SUGAMMADEX SODIUM 200 MG/2ML IV SOLN
INTRAVENOUS | Status: DC | PRN
  Administered 2021-07-22: 200 mg via INTRAVENOUS

## 2021-07-22 MED ORDER — HYDROMORPHONE HCL 1 MG/ML IJ SOLN
INTRAMUSCULAR | Status: AC
  Filled 2021-07-22: qty 0.5

## 2021-07-22 MED ORDER — EPHEDRINE SULFATE 50 MG/ML IJ SOLN
INTRAMUSCULAR | Status: DC | PRN
  Administered 2021-07-22 (×3): 10 mg via INTRAVENOUS

## 2021-07-22 MED ORDER — ONDANSETRON HCL 4 MG/2ML IJ SOLN
INTRAMUSCULAR | Status: AC
  Filled 2021-07-22: qty 2

## 2021-07-22 MED ORDER — MIDAZOLAM HCL 2 MG/2ML IJ SOLN
INTRAMUSCULAR | Status: AC
  Filled 2021-07-22: qty 2

## 2021-07-22 MED ORDER — 0.9 % SODIUM CHLORIDE (POUR BTL) OPTIME
TOPICAL | Status: DC | PRN
  Administered 2021-07-22: 1000 mL

## 2021-07-22 MED ORDER — ACETAMINOPHEN 500 MG PO TABS
1000.0000 mg | ORAL_TABLET | Freq: Four times a day (QID) | ORAL | Status: DC
  Administered 2021-07-22 – 2021-07-23 (×2): 1000 mg via ORAL
  Filled 2021-07-22 (×2): qty 2

## 2021-07-22 SURGICAL SUPPLY — 73 items
ALLOGRAFT PERF 16X20 1.6+/-0.4 (Tissue) ×4 IMPLANT
BAG DECANTER FOR FLEXI CONT (MISCELLANEOUS) ×4 IMPLANT
BINDER BREAST XLRG (GAUZE/BANDAGES/DRESSINGS) IMPLANT
BLADE SURG 10 STRL SS (BLADE) ×8 IMPLANT
BLADE SURG 15 STRL LF DISP TIS (BLADE) ×3 IMPLANT
BLADE SURG 15 STRL SS (BLADE) ×4
CANISTER SUCT 1200ML W/VALVE (MISCELLANEOUS) ×6 IMPLANT
CHLORAPREP W/TINT 26 (MISCELLANEOUS) ×8 IMPLANT
CLIP TI MEDIUM 6 (CLIP) ×10 IMPLANT
COUNTER NEEDLE 1200 MAGNETIC (NEEDLE) ×2 IMPLANT
COVER BACK TABLE 60X90IN (DRAPES) ×2 IMPLANT
COVER MAYO STAND STRL (DRAPES) ×8 IMPLANT
COVER PROBE W GEL 5X96 (DRAPES) ×6 IMPLANT
DERMABOND ADVANCED (GAUZE/BANDAGES/DRESSINGS) ×2
DERMABOND ADVANCED .7 DNX12 (GAUZE/BANDAGES/DRESSINGS) ×6 IMPLANT
DRAIN CHANNEL 15F RND FF W/TCR (WOUND CARE) ×4 IMPLANT
DRAIN CHANNEL 19F RND (DRAIN) ×8 IMPLANT
DRAPE INCISE IOBAN 66X45 STRL (DRAPES) ×4 IMPLANT
DRAPE TOP ARMCOVERS (MISCELLANEOUS) ×4 IMPLANT
DRAPE U-SHAPE 76X120 STRL (DRAPES) ×6 IMPLANT
DRAPE UTILITY XL STRL (DRAPES) ×6 IMPLANT
DRSG PAD ABDOMINAL 8X10 ST (GAUZE/BANDAGES/DRESSINGS) ×8 IMPLANT
DRSG TEGADERM 4X10 (GAUZE/BANDAGES/DRESSINGS) ×8 IMPLANT
ELECT BLADE 4.0 EZ CLEAN MEGAD (MISCELLANEOUS) ×4
ELECT REM PT RETURN 9FT ADLT (ELECTROSURGICAL) ×4
ELECTRODE BLDE 4.0 EZ CLN MEGD (MISCELLANEOUS) ×3 IMPLANT
ELECTRODE REM PT RTRN 9FT ADLT (ELECTROSURGICAL) ×3 IMPLANT
EVACUATOR SILICONE 100CC (DRAIN) ×12 IMPLANT
EXPANDER TISSUE FV FOURTE 500 (Prosthesis & Implant Plastic) ×2 IMPLANT
GLOVE SURG ENC MOIS LTX SZ6 (GLOVE) ×4 IMPLANT
GLOVE SURG HYDRASOFT LTX SZ5.5 (GLOVE) ×8 IMPLANT
GLOVE SURG UNDER POLY LF SZ6.5 (GLOVE) ×6 IMPLANT
GOWN STRL REUS W/ TWL LRG LVL3 (GOWN DISPOSABLE) ×10 IMPLANT
GOWN STRL REUS W/TWL 2XL LVL3 (GOWN DISPOSABLE) ×4 IMPLANT
GOWN STRL REUS W/TWL LRG LVL3 (GOWN DISPOSABLE) ×8
KIT FILL ASEPTIC TRANSFER (MISCELLANEOUS) ×2 IMPLANT
LIGHT WAVEGUIDE WIDE FLAT (MISCELLANEOUS) ×4 IMPLANT
MARKER SKIN DUAL TIP RULER LAB (MISCELLANEOUS) IMPLANT
NDL HYPO 25X1 1.5 SAFETY (NEEDLE) IMPLANT
NDL SAFETY ECLIPSE 18X1.5 (NEEDLE) ×3 IMPLANT
NEEDLE HYPO 18GX1.5 SHARP (NEEDLE) ×4
NEEDLE HYPO 25X1 1.5 SAFETY (NEEDLE) ×4 IMPLANT
NS IRRIG 1000ML POUR BTL (IV SOLUTION) ×4 IMPLANT
PACK BASIN DAY SURGERY FS (CUSTOM PROCEDURE TRAY) ×4 IMPLANT
PENCIL SMOKE EVACUATOR (MISCELLANEOUS) ×4 IMPLANT
PIN SAFETY STERILE (MISCELLANEOUS) ×6 IMPLANT
SHEET MEDIUM DRAPE 40X70 STRL (DRAPES) ×4 IMPLANT
SLEEVE SCD COMPRESS KNEE MED (STOCKING) ×4 IMPLANT
SPONGE T-LAP 18X18 ~~LOC~~+RFID (SPONGE) ×14 IMPLANT
STAPLER VISISTAT 35W (STAPLE) ×4 IMPLANT
SUT CHROMIC 4 0 PS 2 18 (SUTURE) ×10 IMPLANT
SUT ETHILON 2 0 FS 18 (SUTURE) ×8 IMPLANT
SUT MNCRL AB 4-0 PS2 18 (SUTURE) ×10 IMPLANT
SUT MON AB 4-0 PC3 18 (SUTURE) ×2 IMPLANT
SUT SILK 2 0 SH (SUTURE) ×6 IMPLANT
SUT VIC AB 2-0 SH 27 (SUTURE)
SUT VIC AB 2-0 SH 27XBRD (SUTURE) IMPLANT
SUT VIC AB 3-0 SH 27 (SUTURE) ×8
SUT VIC AB 3-0 SH 27X BRD (SUTURE) ×6 IMPLANT
SUT VICRYL 0 CT-2 (SUTURE) ×8 IMPLANT
SUT VICRYL 3-0 CR8 SH (SUTURE) ×4 IMPLANT
SUT VICRYL 4-0 PS2 18IN ABS (SUTURE) ×4 IMPLANT
SUT VLOC 180 0 24IN GS25 (SUTURE) ×4 IMPLANT
SYR 50ML LL SCALE MARK (SYRINGE) ×4 IMPLANT
SYR BULB IRRIG 60ML STRL (SYRINGE) ×4 IMPLANT
TISSUE EXPNDR FV FOURTE 500 (Prosthesis & Implant Plastic) ×8 IMPLANT
TOWEL GREEN STERILE FF (TOWEL DISPOSABLE) ×6 IMPLANT
TRACER MAGTRACE VIAL (MISCELLANEOUS) ×2 IMPLANT
TRAY DSU PREP LF (CUSTOM PROCEDURE TRAY) ×4 IMPLANT
TRAY FOLEY W/BAG SLVR 14FR LF (SET/KITS/TRAYS/PACK) ×2 IMPLANT
TUBE CONNECTING 20X1/4 (TUBING) ×4 IMPLANT
UNDERPAD 30X36 HEAVY ABSORB (UNDERPADS AND DIAPERS) ×8 IMPLANT
YANKAUER SUCT BULB TIP NO VENT (SUCTIONS) ×4 IMPLANT

## 2021-07-22 NOTE — Anesthesia Procedure Notes (Signed)
Procedure Name: Intubation Date/Time: 07/22/2021 8:08 AM Performed by: Tawni Millers, CRNA Pre-anesthesia Checklist: Patient identified, Emergency Drugs available, Suction available and Patient being monitored Patient Re-evaluated:Patient Re-evaluated prior to induction Oxygen Delivery Method: Circle system utilized Preoxygenation: Pre-oxygenation with 100% oxygen Induction Type: IV induction Ventilation: Mask ventilation without difficulty Laryngoscope Size: Mac and 3 Grade View: Grade I Tube type: Oral Tube size: 7.0 mm Number of attempts: 1 Airway Equipment and Method: Stylet and Oral airway Placement Confirmation: ETT inserted through vocal cords under direct vision, positive ETCO2 and breath sounds checked- equal and bilateral Tube secured with: Tape Dental Injury: Teeth and Oropharynx as per pre-operative assessment

## 2021-07-22 NOTE — Progress Notes (Signed)
Emotional support given during sentinel node injections. Patient tolerated well.

## 2021-07-22 NOTE — H&P (Signed)
REFERRING PHYSICIAN: Dr. Jeanmarie Plant  PROVIDER: Georgianne Fick, MD  Care Team: Patient Care Team: Milagros Evener, MD as PCP - General (Family Medicine) Barry Dienes Ballard Russell, MD as Consulting Provider (Surgical Oncology) Rulon Eisenmenger, MD (Hematology and Oncology) Marye Round, MD (Radiation Oncology)   MRN: [id number] DOB: [birth year]  Subjective   Chief Complaint: Breast Cancer   History of Present Illness: Sarah Vincent is a 50 y.o. female who is seen today as an office consultation at the request of Dr. Jeanmarie Plant for evaluation of Breast Cancer .   Pt is a lovely 50 yo F who presents 05/2021 with a new left breast cancer. She had screening detected left breast calcifications. Diagnostic imaging was performed and this showed 1.8 cm of calcifications. 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 Axillary u/s was performed based on the suspicion of invasion. This was negative. An MRI was ordered due to her breast density of D. This showed 4.6 cm of non mass enhancement and suspicious nodes.   She has no prior personal history of cancer until this, but has family history of a paternal grandmother with gallbladder cancer and a paternal uncle with bladder cancer.   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 She is a never smoker and drinks around 1-2 glasses of wine per week. She had menarche at age 20 and has a 22 IUD with minimal occasional spotting, so isn't sure if she is perimenopausal or not, but she hasn't had any hot flashes. She is a G2P2 with first child at age 67. She used hormonal contraception for around 12 years. She is up to date with her colonoscopy (2015).   Diagnostic mammogram 05/09/21 [facility] EXAM: DIGITAL DIAGNOSTIC UNILATERAL LEFT MAMMOGRAM WITH CAD   TECHNIQUE: Left digital diagnostic mammography was performed. Mammographic images were processed with CAD.   COMPARISON: Previous exam(s).   ACR Breast  Density Category d: The breast tissue is extremely dense, which lowers the sensitivity of mammography.   FINDINGS: Grouped calcifications in the far upper outer quadrant of the left breast demonstrate a pleomorphic morphology. They span 1.8 x 1.8 x 1 cm. These have an indeterminate morphology.   IMPRESSION: Indeterminate left breast calcifications spanning up to 1.8 cm. Recommendation is for stereotactic biopsy.   RECOMMENDATION: Stereotactic biopsy of the left breast.   I have discussed the findings and recommendations with the patient. If applicable, a reminder letter will be sent to the patient regarding the next appointment.   BI-RADS CATEGORY 4: Suspicious.  EXAM: 05/22/21 ULTRASOUND OF THE LEFT AXILLA   COMPARISON: Previous exam(s).   FINDINGS: Ultrasound of the left axilla demonstrates multiple normal-appearing lymph nodes.   IMPRESSION: No evidence of left axillary lymphadenopathy.   RECOMMENDATION: Continued treatment plan. The patient is scheduled for breast MRI tomorrow.   I have discussed the findings and recommendations with the patient. If applicable, a reminder letter will be sent to the patient regarding the next appointment.   BI-RADS CATEGORY 1: Negative.  Breast MRI 05/23/21 FINDINGS: Breast composition: d. Extreme fibroglandular tissue.   Background parenchymal enhancement: Moderate   Right breast: No mass or abnormal enhancement.   Left breast: Post biopsy changes with surrounding enhancement is seen in the lateral aspect of the left breast (series 6, image 58). The post biopsy changes and enhancement measures 4.6 cm. Signal void artifact is seen at the biopsy site from the biopsy marker clip.   Lymph nodes: In the left axilla there are  approximately 7 lymph nodes. Some of these appear to have cortical thickening measuring up to 5 mm.   Ancillary findings: None.   IMPRESSION: Post biopsy changes in enhancement in the lateral aspect of  the left breast measuring 4.6 cm.   Prominent left axillary lymph nodes.   RECOMMENDATION: Sonographic evaluation of the left axilla is recommended. If enlarged lymph nodes are visualized biopsy would be suggested.   Treatment planning of the biopsy proven high-grade ductal carcinoma with calcifications and necrosis, focally suspicious for micro invasion is recommended.   BI-RADS CATEGORY 4: Suspicious.  Pathology core needle biopsy: 05/16/21 Breast, left, needle core biopsy, UOQ posterior - HIGH-GRADE DUCTAL CARCINOMA IN SITU WITH NECROSIS AND CALCIFICATIONS, FOCALLY SUSPICIOUS FOR MICROINVASION.  Receptors: Estrogen Receptor: 95%, POSITIVE, STRONG STAINING INTENSITY Progesterone Receptor: 95%, POSITIVE, STRONG STAINING INTENSITY  Review of Systems: A complete review of systems was obtained from the patient. I have reviewed this information and discussed as appropriate with the patient. See HPI as well for other ROS.  Review of Systems  Musculoskeletal: Positive for back pain.  Neurological: Positive for headaches.  All other systems reviewed and are negative.   Medical History: Past Medical History:  Diagnosis Date   Anxiety   History of cancer   Patient Active Problem List  Diagnosis   Malignant neoplasm of upper-outer quadrant of left breast in female, estrogen receptor positive (CMS-HCC)   Dense breasts   History reviewed. No pertinent surgical history.   Allergies  Allergen Reactions   Penicillin Unknown   Current Outpatient Medications on File Prior to Visit  Medication Sig Dispense Refill   rOPINIRole (REQUIP) 0.25 MG tablet Take 0.25 mg by mouth 3 (three) times daily   sertraline (ZOLOFT) 50 MG tablet Take 50 mg by mouth once daily   spironolactone (ALDACTONE) 50 MG tablet Take 50 mg by mouth once daily   No current facility-administered medications on file prior to visit.   Family History  Problem Relation Age of Onset   High blood pressure  (Hypertension) Mother   High blood pressure (Hypertension) Father   High blood pressure (Hypertension) Brother    Social History   Tobacco Use  Smoking Status Never Smoker  Smokeless Tobacco Never Used    Social History   Socioeconomic History   Marital status: Unknown  Tobacco Use   Smoking status: Never Smoker   Smokeless tobacco: Never Used  Scientific laboratory technician Use: Never used  Substance and Sexual Activity   Alcohol use: Yes   Drug use: Never   Objective:   Vitals:  BP: (!) 151/70  Pulse: 70  Resp: 18  Temp: 36.8 C (98.2 F)  Weight: 82.6 kg (182 lb)  Height: 165.1 cm (5\' 5" )   Body mass index is 30.29 kg/m.  Gen: No acute distress. Well nourished and well groomed.  Neurological: Alert and oriented to person, place, and time. Coordination normal.  Head: Normocephalic and atraumatic.  Eyes: Conjunctivae are normal. Pupils are equal, round, and reactive to light. No scleral icterus.  Neck: Normal range of motion. Neck supple. No tracheal deviation or thyromegaly present.  Cardiovascular: Normal rate, regular rhythm, normal heart sounds and intact distal pulses. Exam reveals no gallop and no friction rub. No murmur heard. Breast: no palpable masses. Breasts dense bilaterally. Minimal ptosis. No nipple retraction or nipple discharge. No LAD.  Respiratory: Effort normal. No respiratory distress. No chest wall tenderness. Breath sounds normal. No wheezes, rales or rhonchi.  GI: Soft. Bowel sounds are  normal. The abdomen is soft and nontender. There is no rebound and no guarding.  Musculoskeletal: Normal range of motion. Extremities are nontender.  Lymphadenopathy: No cervical, preauricular, postauricular or axillary adenopathy is present Skin: Skin is warm and dry. No rash noted. No diaphoresis. No erythema. No pallor. No clubbing, cyanosis, or edema.  Psychiatric: Normal mood and affect. Behavior is normal. Judgment and thought content normal.   Labs CBC and CMET  normal 05/28/21  Assessment and Plan:   Malignant neoplasm of upper-outer quadrant of left breast in female, estrogen receptor positive (CMS-HCC) This is a bit complicated. Her path indicates a Stage 0 left breast cancer. The mammogram demonstrated 1.8 cm of calcifications, but an MRI done for very dense breasts afterward showed a larger concerning area and concerning nodes. This nodal finding is discordant with an axillary u/s the day before.   In order to hopefully be able to pursue breast conservation, we will plan left MR guided biopsy of the non mass enhancement for extent of disease. If nodes still look abnormal we will definitely plan SLN bx. We are leaning toward a sentinel node biopsy anyway, but if we are left with just a small area of DCIS, would likely leave this off.   She may still be able to pursue breast conservation with a bracketed approach of a larger area, but in this case, she would need contralateral reduction for symmetry. I am going to go ahead and send her to see plastics in order to get more info.  She may elect to pursue mastectomy if the larger area is DCIS in which case we will definitely do sentinel node biopsy.   I will see her back prior to surgery if we have any surgical plan other than seed localized lumpectomy +/- sentinel node biopsy.   Genetic testing will also be pursued due to young age.   Dense breasts Due to extremely dense breasts, we will plan annual MRI  No follow-ups on file.  Milus Height, MD FACS Surgical Oncology, General Surgery, Trauma and [facility] [place]

## 2021-07-22 NOTE — Anesthesia Postprocedure Evaluation (Signed)
Anesthesia Post Note  Patient: Sarah Vincent  Procedure(s) Performed: BILATERAL TOTAL MASTECTOMY WITH LEFT SENTINEL LYMPH NODE BIOPSY (Bilateral: Breast) BILATERAL BREAST RECONSTRUCTION WITH PLACEMENT OF TISSUE EXPANDER AND ALLODERM (Bilateral)     Patient location during evaluation: PACU Anesthesia Type: General Level of consciousness: awake and alert Pain management: pain level controlled Vital Signs Assessment: post-procedure vital signs reviewed and stable Respiratory status: spontaneous breathing, nonlabored ventilation and respiratory function stable Cardiovascular status: blood pressure returned to baseline and stable Postop Assessment: no apparent nausea or vomiting Anesthetic complications: no   No notable events documented.  Last Vitals:  Vitals:   07/22/21 1330 07/22/21 1405  BP: 129/77 131/82  Pulse: 75 82  Resp: 13 18  Temp:  36.6 C  SpO2: 95% 97%    Last Pain:  Vitals:   07/22/21 1405  TempSrc:   PainSc: 3                  Lidia Collum

## 2021-07-22 NOTE — Anesthesia Procedure Notes (Signed)
Anesthesia Regional Block: Pectoralis block   Pre-Anesthetic Checklist: , timeout performed,  Correct Patient, Correct Site, Correct Laterality,  Correct Procedure, Correct Position, site marked,  Risks and benefits discussed,  Surgical consent,  Pre-op evaluation,  At surgeon's request and post-op pain management  Laterality: Left and Right  Prep: chloraprep       Needles:  Injection technique: Single-shot  Needle Type: Stimiplex     Needle Length: 9cm  Needle Gauge: 21     Additional Needles:   Procedures:,,,, ultrasound used (permanent image in chart),,    Narrative:  Start time: 07/22/2021 7:14 AM End time: 07/22/2021 7:19 AM Injection made incrementally with aspirations every 5 mL.  Performed by: Personally  Anesthesiologist: Lynda Rainwater, MD

## 2021-07-22 NOTE — Progress Notes (Signed)
Assisted Dr. Sabra Heck with right, left, ultrasound guided, pectoralis block. Side rails up, monitors on throughout procedure. See vital signs in flow sheet. Tolerated Procedure well.

## 2021-07-22 NOTE — Op Note (Signed)
Operative Note   DATE OF OPERATION: 10.4.22  LOCATION:  [facility]  SURGICAL DIVISION: Plastic Surgery  PREOPERATIVE DIAGNOSES:  1. DCIS left breast 2. BARD1 mutation  POSTOPERATIVE DIAGNOSES:  same  PROCEDURE:  1. Bilateral breast reconstruction with tissue expanders  2. Acellular dermis (Alloderm) to bilateral chest 600 cm2  SURGEON: Irene Limbo MD MBA  ASSISTANT: none  ANESTHESIA:  General.   EBL: 200 ml for entire procedure  COMPLICATIONS: None immediate.   INDICATIONS FOR PROCEDURE:  The patient, Sarah Vincent, is a 50 y.o. female born on [birth year], is here for immediate prepectoral expander based reconstruction following skin reduction pattern mastectomies.   FINDINGS: Natrelle 133S FV-13-T 500 ml tissue expanders placed bilateral initial fill volume 150 ml air bilateral. RIGHT SN [id number] LEFT SN [id number]  DESCRIPTION OF PROCEDURE:  The patient was marked with the patient in the preoperative area to mark sternal notch, chest midline, anterior axillary lines and inframammary folds. Patient was marked for skin reduction mastectomy with most superior portion nipple areola marked on breast meridian. Vertical limbs marked by breast displacement and set at 9 cm length. The patient was taken to the operating room. SCDs were placed and IV antibiotics were given. Foley catheter placed. The patient's operative site was prepped and draped in a sterile fashion. A time out was performed and all information was confirmed to be correct. In supine position, the lateral limbs for resection marked and area over lower pole preserved as inferiorly based dermal pedicle. Skin de epithelialized in this area. I assisted in mastectomies with retraction and exposure. Following completion of mastectomies, reconstruction began on right side.   The cavity was irrigated with solution containing Ancef, Betadine, and gentamicin. Hemostasis was ensured. A 19 Fr drain was placed in  subcutaneous position laterally and a 15 Fr drain placed along inframammary fold. Each secured to skin with 2-0 nylon. The tissue expanders were prepared on back table prior in insertion. The expander was filled with air. Perforated acellular dermis was  draped over anterior surface expander. The ADM was then secured to itself over posterior surface of expander with 4-0 chromic suture. Redundant folds acellular dermis excised so that the ADM lied flat without folds over air filled expander. The expander was secured to medial insertion pectoralis with a 0 vicryl. The superior and lateral tabs also secured to pectoralis muscle with 0-vicryl. The ADM was secured to pectoralis muscle and chest wall along inferior border at inframammary fold. Laterally the mastectomy flap over posterior axillary line was advanced anteriorly and the subcutaneous tissue and superficial fascia was secured to pectoralis muscle and acellular dermis with 0-vicryl. The inferiorly based dermal pedicle was redraped superiorly over expander and acellular dermis and secured to pectoralis with interrupted 0-vicryl. Skin closure completed with 3-0 vicryl in fascial layer and 4-0 vicryl in dermis. Skin closure completed with 4-0 monocryl subcuticular and tissue adhesive.   I then directed my attention to left chest where similar irrigation and drain placement completed. The prepared expander with ADM secured over anterior surface was placed in right chest and tabs secured to chest wall and pectoralis muscle with 0- vicryl suture. The acellular dermis at inframammary fold was secured to chest wall with 0 V-lock suture. Laterally the mastectomy flap over posterior axillary line was advanced anteriorly and the subcutaneous tissue and superficial fascia was secured to pectoralis muscle and acellular dermis with 0-vicryl. The inferiorly based dermal pedicle was redraped superiorly over expander and acellular dermis and secured to pectoralis with  interrupted 0-vicryl. Skin closure completed with 3-0 vicryl in fascial layer and 4-0 vicryl in dermis. Skin closure completed with 4-0 monocryl subcuticular and tissue adhesive. Tegaderms applied bilateral, followed by dry dressing and breast binder.   The patient was allowed to wake from anesthesia, extubated and taken to the recovery room in satisfactory condition.   SPECIMENS: none  DRAINS: 15 and 19 Fr JP in right and left breast reconstruction

## 2021-07-22 NOTE — Op Note (Signed)
Bilateral Mastectomy with left Sentinel Node Mapping and Biopsy followed by reconstruction (dictated separately by Dr. Iran Planas)  Indications: This patient presents with history of left breast cancer, upper outer quadrant, high grade DCIS suspicious for microinvasion, 4.6 cm.  Pt has dense breasts and would require annual MRI screening if she pursued unilateral mastectomy.  She desired symmetry and decreased need for screening.    Pre-operative Diagnosis: Left breast cancer, cTis  Post-operative Diagnosis: same  Surgeon: Stark Klein   Anesthesia: General endotracheal anesthesia and pectoral block  ASA Class: 3  Procedure Details  The patient was seen in the Holding Room. The risks, benefits, complications, treatment options, and expected outcomes were discussed with the patient. The possibilities of reaction to medication, pulmonary aspiration, bleeding, infection, the need for additional procedures, failure to diagnose a condition, and creating a complication requiring transfusion or operation were discussed with the patient. The patient concurred with the proposed plan, giving informed consent.  The site of surgery properly noted/marked. The patient was taken to Operating Room # 6, identified as [facility] and the procedure verified as bilateral Mastectomy and left Sentinel Node Biopsy. A Time Out was held and the above information confirmed.  The Magtrace was injected in the subareolar location and the breast was massaged for 5 minutes.    After induction of anesthesia, the bilateral breast and chest were prepped and draped in standard fashion.  The borders of the breast were identified and marked. Dr. Iran Planas drew out her desired triangular incisions. The right side was addressed first.  Incision was made with a #10 blade. Mastectomy hooks were used to provide elevation of the skin edges, and the cautery was used to create the mastectomy flaps.  The dissection was taken to the fascia  of the pectoralis major.  The penetrating vessels were clipped as needed.  The superior flap was taken medially to the lateral sternal border, superiorly to the inferior border of the clavicle.  The inferior flap was similarly created, inferiorly to the inframammary fold and laterally to the border of the latissimus.  The breast was taken off including the pectoralis fascia and the axillary tail marked.    The left side was addressed similarly.  Once the breast was off the chest wall, the sentinel node was addressed.  Using a hand-held probe, axillary sentinel nodes were identified.  Four deep level 2 axillary sentinel nodes were removed and submitted to pathology.  The findings are below.  The lymphovascular channels were clipped with metal clips.        The wound was irrigated and inspected for hemostasis.  The patient was left with Dr. Iran Planas for reconstruction.    Findings: grossly clear surgical margins SLN #1 + MagTrace 390/- T99 SLN #2 + both MagTrace 3100 and T99 650 SLN #3 + both MagTrace 540 and T99 119 SLN #4 palpable Background 50 MagTrace/19 T99  Estimated Blood Loss: <50 mL          Drains: 19 Fr blake drain in each axilla per Dr. Iran Planas                Specimens: right breast, left breast,  and four deep left axillary sentinel nodes         Complications:  None; patient tolerated the procedure well.         Disposition: PACU - hemodynamically stable.         Condition: stable

## 2021-07-22 NOTE — Interval H&P Note (Signed)
History and Physical Interval Note:  07/22/2021 6:58 AM  Sarah Vincent  has presented today for surgery, with the diagnosis of LEFT BREAST CANCER.  The various methods of treatment have been discussed with the patient and family. After consideration of risks, benefits and other options for treatment, the patient has consented to  Procedure(s): BILATERAL TOTAL MASTECTOMY (Bilateral) LEFT SENTINEL NODE BIOPSY (Left) BILATERAL BREAST RECONSTRUCTION WITH PLACEMENT OF TISSUE EXPANDER AND ALLODERM (Bilateral) as a surgical intervention.  The patient's history has been reviewed, patient examined, no change in status, stable for surgery.  I have reviewed the patient's chart and labs.  Questions were answered to the patient's satisfaction.     Arnoldo Hooker Anaka Beazer

## 2021-07-22 NOTE — Interval H&P Note (Signed)
History and Physical Interval Note:  07/22/2021 7:30 AM  Sarah Vincent  has presented today for surgery, with the diagnosis of LEFT BREAST CANCER.  The various methods of treatment have been discussed with the patient and family. After consideration of risks, benefits and other options for treatment, the patient has consented to  Procedure(s): BILATERAL TOTAL MASTECTOMY WITH LEFT SENTINEL LYMPH NODE BIOPSY (Bilateral) BILATERAL BREAST RECONSTRUCTION WITH PLACEMENT OF TISSUE EXPANDER AND ALLODERM (Bilateral) as a surgical intervention.  The patient's history has been reviewed, patient examined, no change in status, stable for surgery.  I have reviewed the patient's chart and labs.  Questions were answered to the patient's satisfaction.     Stark Klein

## 2021-07-22 NOTE — Transfer of Care (Signed)
Immediate Anesthesia Transfer of Care Note  Patient: Sarah Vincent  Procedure(s) Performed: BILATERAL TOTAL MASTECTOMY WITH LEFT SENTINEL LYMPH NODE BIOPSY (Bilateral: Breast) BILATERAL BREAST RECONSTRUCTION WITH PLACEMENT OF TISSUE EXPANDER AND ALLODERM (Bilateral)  Patient Location: PACU  Anesthesia Type:GA combined with regional for post-op pain  Level of Consciousness: awake and alert   Airway & Oxygen Therapy: Patient Spontanous Breathing and Patient connected to face mask oxygen  Post-op Assessment: Report given to RN and Post -op Vital signs reviewed and stable  Post vital signs: Reviewed and stable  Last Vitals:  Vitals Value Taken Time  BP 126/60 07/22/21 1227  Temp    Pulse 89 07/22/21 1228  Resp 15 07/22/21 1228  SpO2 94 % 07/22/21 1228  Vitals shown include unvalidated device data.  Last Pain:  Vitals:   07/22/21 0643  TempSrc: Oral  PainSc: 0-No pain         Complications: No notable events documented.

## 2021-07-22 NOTE — Anesthesia Preprocedure Evaluation (Signed)
Anesthesia Evaluation  Patient identified by MRN, date of birth, ID band Patient awake    Reviewed: Allergy & Precautions, NPO status , Patient's Chart, lab work & pertinent test results  Airway Mallampati: II  TM Distance: >3 FB Neck ROM: Full    Dental no notable dental hx.    Pulmonary neg pulmonary ROS,    Pulmonary exam normal breath sounds clear to auscultation       Cardiovascular negative cardio ROS Normal cardiovascular exam Rhythm:Regular Rate:Normal     Neuro/Psych Anxiety Depression negative neurological ROS  negative psych ROS   GI/Hepatic negative GI ROS, Neg liver ROS,   Endo/Other  negative endocrine ROS  Renal/GU negative Renal ROS  negative genitourinary   Musculoskeletal negative musculoskeletal ROS (+)   Abdominal   Peds negative pediatric ROS (+)  Hematology negative hematology ROS (+)   Anesthesia Other Findings Breast Cancer  Reproductive/Obstetrics negative OB ROS                             Anesthesia Physical Anesthesia Plan  ASA: 3  Anesthesia Plan: General   Post-op Pain Management:  Regional for Post-op pain   Induction: Intravenous  PONV Risk Score and Plan: 3 and Ondansetron, Dexamethasone, Midazolam and Treatment may vary due to age or medical condition  Airway Management Planned: Oral ETT  Additional Equipment:   Intra-op Plan:   Post-operative Plan: Extubation in OR  Informed Consent: I have reviewed the patients History and Physical, chart, labs and discussed the procedure including the risks, benefits and alternatives for the proposed anesthesia with the patient or authorized representative who has indicated his/her understanding and acceptance.     Dental advisory given  Plan Discussed with: CRNA  Anesthesia Plan Comments:         Anesthesia Quick Evaluation

## 2021-07-23 ENCOUNTER — Encounter (HOSPITAL_BASED_OUTPATIENT_CLINIC_OR_DEPARTMENT_OTHER): Payer: Self-pay | Admitting: General Surgery

## 2021-07-23 NOTE — Discharge Instructions (Signed)
About my Jackson-Pratt Bulb Drain  What is a Jackson-Pratt bulb? A Jackson-Pratt is a soft, round device used to collect drainage. It is connected to a long, thin drainage catheter, which is held in place by one or two small stiches near your surgical incision site. When the bulb is squeezed, it forms a vacuum, forcing the drainage to empty into the bulb.  Emptying the Jackson-Pratt bulb- To empty the bulb: 1. Release the plug on the top of the bulb. 2. Pour the bulb's contents into a measuring container which your nurse will provide. 3. Record the time emptied and amount of drainage. Empty the drain(s) as often as your     doctor or nurse recommends.  Date                  Time                    Amount (Drain 1)                 Amount (Drain 2)                     Amount (Drain 3)                        Amount (Drain 4)  _____________________________________________________________________  _____________________________________________________________________  _____________________________________________________________________  _____________________________________________________________________  _____________________________________________________________________  _____________________________________________________________________  _____________________________________________________________________  _____________________________________________________________________  Squeezing the Jackson-Pratt Bulb- To squeeze the bulb: 1. Make sure the plug at the top of the bulb is open. 2. Squeeze the bulb tightly in your fist. You will hear air squeezing from the bulb. 3. Replace the plug while the bulb is squeezed. 4. Use a safety pin to attach the bulb to your clothing. This will keep the catheter from     pulling at the bulb insertion site.  When to call your doctor- Call your doctor if: Drain site becomes red, swollen or hot. You have a fever greater than 101 degrees  F. There is oozing at the drain site. Drain falls out (apply a guaze bandage over the drain hole and secure it with tape). Drainage increases daily not related to activity patterns. (You will usually have more drainage when you are active than when you are resting.) Drainage has a bad odor.  

## 2021-07-23 NOTE — Discharge Summary (Signed)
Physician Discharge Summary  Patient ID: Sarah Vincent MRN: [id number] DOB/AGE: [birth year] 50 y.o.  Admit date: 07/22/2021 Discharge date: 07/23/2021  Admission Diagnoses: Left breast DCIS  Discharge Diagnoses:  same   Discharged Condition: stable  Hospital Course: Post operatively patient did well tolerating diet, pain controlled and ambulatory with minimal assist. Reviewed bathing and drain care.  Treatments: surgery: 10.4.22 bilateral mastectomies left sentinel node bilateral breast reconstruciton with tissue expanders acellular dermis  Discharge Exam: Blood pressure 128/71, pulse 74, temperature 98.1 F (36.7 C), resp. rate 18, height 5\' 7"  (1.702 m), weight 78.3 kg, SpO2 100 %. Incision/Wound: chest soft incisions intact drains serosanguinous skin flaps viable  Disposition: Discharge disposition: 01-Home or Self Care       Discharge Instructions     Call MD for:  redness, tenderness, or signs of infection (pain, swelling, bleeding, redness, odor or green/yellow discharge around incision site)   Complete by: As directed    Call MD for:  temperature >100.5   Complete by: As directed    Discharge instructions   Complete by: As directed    Ok to remove dressings and shower am 10.6.22. Soap and water ok, pat Tegaderms dry. Do not remove Tegaderms. No creams or ointments over incisions. Do not let drains dangle in shower, attach to lanyard or similar.Strip and record drains twice daily and bring log to clinic visit.  Breast binder or soft compression bra all other times.  Ok to raise arms above shoulders for bathing and dressing.  No house yard work or exercise until cleared by MD.   Recommend ibuprofen with meals to aid with pain control. Patient received all Rx preop. Recommend Miralax or Dulcolax as needed for constipation.   Driving Restrictions   Complete by: As directed    No driving for 2 weeks then no driving if taking prescription pain medication    Lifting restrictions   Complete by: As directed    No lifting > 5-10 lbs until cleared by MD   Resume previous diet   Complete by: As directed         Follow-up Information     Stark Klein, MD Follow up in 2 week(s).   Specialty: General Surgery Contact information: [address] [place] [address] [phone]         Irene Limbo, MD Follow up in 1 week(s).   Specialty: Plastic Surgery Why: as scheduled Contact information: [address] [place] [address] [phone]                 Signed: Irene Limbo 07/23/2021, 8:03 AM

## 2021-07-24 LAB — SURGICAL PATHOLOGY

## 2021-07-28 ENCOUNTER — Encounter: Payer: Self-pay | Admitting: *Deleted

## 2021-07-30 NOTE — Progress Notes (Signed)
Patient Care Team: Rankins, Bill Salinas, MD as PCP - General (Family Medicine) Rockwell Germany, RN as Oncology Nurse Navigator Tressie Ellis, Paulette Blanch, RN as Oncology Nurse Navigator Stark Klein, MD as Consulting Physician (General Surgery) Nicholas Lose, MD as Consulting Physician (Hematology and Oncology) Kyung Rudd, MD as Consulting Physician (Radiation Oncology)  DIAGNOSIS:    ICD-10-CM   1. Ductal carcinoma in situ (DCIS) of left breast  D05.12       SUMMARY OF ONCOLOGIC HISTORY: Oncology History  Ductal carcinoma in situ (DCIS) of left breast  05/20/2021 Initial Diagnosis   Screening mammogram: calcifications in the left breast and no malignancy in the right breast. Diagnostic mammogram and [place]: indeterminate left breast calcifications spanning up to 1.8 cm. Biopsy: high-grade DCIS with necrosis and calcifications, focally suspicious for microinvasion ER/PR+ (95%).    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 Testing was completed through the BRCAplus panel (report date 06/02/2021) and CancerNext-Expanded+RNAinsight panel (report date 06/10/2021) offered by [employer].   The BRCAplus panel offered by [employer] and includes sequencing and deletion/duplication analysis for the following 8 genes: ATM, BRCA1, BRCA2, CDH1, CHEK2, PALB2, PTEN, and TP53. The CancerNext-Expanded + RNAinsight gene panel offered by [employer] and includes sequencing and rearrangement analysis for the following 77 genes: AIP, ALK, APC, ATM, AXIN2, BAP1, BARD1, BLM, BMPR1A, BRCA1, BRCA2, BRIP1, CDC73, CDH1, CDK4, CDKN1B, CDKN2A, CHEK2, CTNNA1,  DICER1, FANCC, FH, FLCN, GALNT12, KIF1B, LZTR1, MAX, MEN1, MET, MLH1, MSH2, MSH3, MSH6, MUTYH, NBN, NF1, NF2, NTHL1, PALB2, PHOX2B, PMS2, POT1, PRKAR1A, PTCH1, PTEN, RAD51C, RAD51D, RB1, RECQL, RET, SDHA, SDHAF2, SDHB, SDHC, SDHD, SMAD4, SMARCA4, SMARCB1, SMARCE1, STK11, SUFU, TMEM127, TP53, TSC1, TSC2, VHL and XRCC2 (sequencing and deletion/duplication); EGFR, EGLN1, HOXB13, KIT, MITF, PDGFRA, POLD1 and POLE (sequencing only); EPCAM and GREM1 (deletion/duplication only). RNA data is routinely analyzed for use in variant interpretation for all genes.   07/22/2021 Surgery   Right mastectomy: Benign Left mastectomy: Focus of microinvasive ductal carcinoma 0.1 cm, grade 1, high-grade DCIS with necrosis, margins negative, LCIS, 0/5 lymph nodes negative, ER 95%, PR 95%, HER2 and Ki-67 not performed     CHIEF COMPLIANT: Follow-up of left breast cancer  INTERVAL HISTORY: Sarah Vincent is a 50 y.o. with above-mentioned history of left breast cancer. Bilateral mastectomies on 07/22/21 showed right breast negative for carcinoma, and left breast grade 1 microinvasive ductal carcinoma, lobular carcinoma in situ, high-grade DCIS with necrosis, and lymph nodes negative for carcinoma. She presents to the clinic today for follow-up.  She is healing and recovering very well from recent surgery.   ALLERGIES:  is allergic to penicillins.  MEDICATIONS:  Current Outpatient Medications  Medication Sig Dispense Refill   rOPINIRole (REQUIP) 0.5 MG tablet Take 0.5 mg by mouth daily.     sertraline (ZOLOFT) 100 MG tablet Take 100 mg by mouth daily.     spironolactone (ALDACTONE) 25 MG tablet Take 25 mg by mouth 2 (two) times daily.     tretinoin (RETIN-A) 0.025 % gel Apply topically at bedtime.     No current facility-administered medications for this visit.    PHYSICAL EXAMINATION: ECOG PERFORMANCE STATUS: 1 - Symptomatic but completely ambulatory  There were no vitals filed for this visit. There were  no vitals filed for this visit.     LABORATORY DATA:  I have reviewed the data as listed CMP Latest Ref Rng & Units 07/18/2021 05/28/2021  Glucose 70 - 99 mg/dL 96 97  BUN 6 - 20 mg/dL 15 17  Creatinine 0.44 - 1.00 mg/dL 0.59 0.71  Sodium 135 - 145 mmol/L 138 139  Potassium 3.5 - 5.1 mmol/L 4.5 4.3  Chloride 98 - 111 mmol/L 103 104  CO2 22 - 32 mmol/L 27 25  Calcium 8.9 - 10.3 mg/dL 9.0 9.4  Total Protein 6.5 - 8.1 g/dL - 7.5  Total Bilirubin 0.3 - 1.2 mg/dL - 0.4  Alkaline Phos 38 - 126 U/L - 95  AST 15 - 41 U/L - 16  ALT 0 - 44 U/L - 12    Lab Results  Component Value Date   WBC 6.9 05/28/2021   HGB 12.3 05/28/2021   HCT 36.1 05/28/2021   MCV 90.5 05/28/2021   PLT 217 05/28/2021   NEUTROABS 4.6 05/28/2021    ASSESSMENT & PLAN:  Ductal carcinoma in situ (DCIS) of left breast 05/20/2021: Screening mammogram detected left breast calcifications UOQ: 1.8 cm, stereotactic biopsy revealed high-grade DCIS with necrosis and calcifications with focal suspicious microinvasion, ER 95%, PR 5%, axilla negative, breast MRI revealed 4.6 cm area of non-mass enhancement, multiple axillary lymph nodes at least 7.  07/22/2021: Bilateral mastectomies:  Right mastectomy: Benign Left mastectomy: Focus of microinvasive ductal carcinoma 0.1 cm, grade 1, high-grade DCIS with necrosis, margins negative, LCIS, 0/5 lymph nodes negative, ER 95%, PR 95%, HER2 and Ki-67 not performed  Pathology counseling: I discussed the final pathology report of the patient provided  a copy of this report. I discussed the margins as well as lymph node surgeries. We also discussed the final staging along with previously performed ER/PR testing. I did not think that patient needs any adjuvant antiestrogen therapy given that she has only micro invasion that was grade 1.  No role of radiation since she had bilateral mastectomies. Follow-up in 3 months for survivorship care plan visit after that she can be followed annually  with long-term survivorship If the patient desires.    No orders of the defined types were placed in this encounter.  The patient has a good understanding of the overall plan. she agrees with it. she will call with any problems that may develop before the next visit here.  Total time spent: 20 mins including face to face time and time spent for planning, charting and coordination of care  Rulon Eisenmenger, MD, MPH 08/01/2021  I, Thana Ates, am acting as scribe for Dr. Nicholas Lose.  I have reviewed the above documentation for accuracy and completeness, and I agree with the above.

## 2021-08-01 ENCOUNTER — Other Ambulatory Visit: Payer: Self-pay

## 2021-08-01 ENCOUNTER — Inpatient Hospital Stay: Payer: BC Managed Care – PPO | Attending: Hematology and Oncology | Admitting: Hematology and Oncology

## 2021-08-01 DIAGNOSIS — D0512 Intraductal carcinoma in situ of left breast: Secondary | ICD-10-CM | POA: Insufficient documentation

## 2021-08-01 DIAGNOSIS — Z79899 Other long term (current) drug therapy: Secondary | ICD-10-CM | POA: Insufficient documentation

## 2021-08-01 DIAGNOSIS — Z170 Estrogen receptor positive status [ER+]: Secondary | ICD-10-CM | POA: Insufficient documentation

## 2021-08-01 NOTE — Assessment & Plan Note (Signed)
05/20/2021: Screening mammogram detected left breast calcifications UOQ: 1.8 cm, stereotactic biopsy revealed high-grade DCIS with necrosis and calcifications with focal suspicious microinvasion, ER 95%, PR 5%, axilla negative, breast MRI revealed 4.6 cm area of non-mass enhancement, multiple axillary lymph nodes at least 7.  07/22/2021: Bilateral mastectomies:  Right mastectomy: Benign Left mastectomy: Focus of microinvasive ductal carcinoma 0.1 cm, grade 1, high-grade DCIS with necrosis, margins negative, LCIS, 0/5 lymph nodes negative, ER 95%, PR 95%, HER2 and Ki-67 not performed  Pathology counseling: I discussed the final pathology report of the patient provided  a copy of this report. I discussed the margins as well as lymph node surgeries. We also discussed the final staging along with previously performed ER/PR and HER-2/neu testing.  Treatment plan: Antiestrogen therapy with tamoxifen x5 years No role of radiation since she had bilateral mastectomies.

## 2021-08-04 ENCOUNTER — Encounter: Payer: Self-pay | Admitting: *Deleted

## 2021-08-04 DIAGNOSIS — D0512 Intraductal carcinoma in situ of left breast: Secondary | ICD-10-CM

## 2021-08-19 ENCOUNTER — Ambulatory Visit: Payer: BC Managed Care – PPO | Attending: General Surgery

## 2021-08-19 ENCOUNTER — Other Ambulatory Visit: Payer: Self-pay

## 2021-08-19 DIAGNOSIS — M25611 Stiffness of right shoulder, not elsewhere classified: Secondary | ICD-10-CM | POA: Insufficient documentation

## 2021-08-19 DIAGNOSIS — M25612 Stiffness of left shoulder, not elsewhere classified: Secondary | ICD-10-CM | POA: Insufficient documentation

## 2021-08-19 DIAGNOSIS — M6281 Muscle weakness (generalized): Secondary | ICD-10-CM | POA: Diagnosis present

## 2021-08-19 DIAGNOSIS — D0512 Intraductal carcinoma in situ of left breast: Secondary | ICD-10-CM | POA: Insufficient documentation

## 2021-08-19 NOTE — Therapy (Signed)
[facility] [place], [place], [address] Phone: [phone]   Fax:  [phone]  Physical Therapy Treatment  Patient Details  Name: Sarah Vincent MRN: [id number] Date of Birth: 12-Dec-1970 Referring Provider (PT): Dr, Barry Dienes   Encounter Date: 08/19/2021   PT End of Session - 08/19/21 1126     Visit Number 2    Number of Visits 14    Date for PT Re-Evaluation 09/30/21    PT Start Time 0800    PT Stop Time 0850    PT Time Calculation (min) 50 min    Activity Tolerance Patient tolerated treatment well    Behavior During Therapy [facility] for tasks assessed/performed             Past Medical History:  Diagnosis Date   Acne    takes Spirolactone   Anxiety    Breast cancer ([place])    Left breast DCIS   Depression    Family history of bladder cancer    Family history of cancer of gallbladder    Restless leg syndrome    takes requip    Past Surgical History:  Procedure Laterality Date   BREAST RECONSTRUCTION WITH PLACEMENT OF TISSUE EXPANDER AND ALLODERM Bilateral 07/22/2021   Procedure: BILATERAL BREAST RECONSTRUCTION WITH PLACEMENT OF TISSUE EXPANDER AND ALLODERM;  Surgeon: Irene Limbo, MD;  Location: [place];  Service: Plastics;  Laterality: Bilateral;   COLONOSCOPY     TOTAL MASTECTOMY Bilateral 07/22/2021   Procedure: BILATERAL TOTAL MASTECTOMY WITH LEFT SENTINEL LYMPH NODE BIOPSY;  Surgeon: Stark Klein, MD;  Location: [facility];  Service: General;  Laterality: Bilateral;    There were no vitals filed for this visit.   Subjective Assessment - 08/19/21 0808     Subjective Pt saw Dr. Barry Dienes yesterday and she said everything looked good.  Had a fill on on 08/13/2021.  The skin feels very tight after that but is starting to adjust. Had 430 cc on one side and 450 on the other side. The exchange will be probably next year. I really don't have any pain, but  discomfort.   Feel some tingling on the left where she took the LN's  Still have some numbness in the back of the left arm. I started this week doing the exercises.  Drains were taken out 2 weeks ago.    Pertinent History Diagnosed with left breast Cancer  with yearly mammogram. Had bilateral mastectomies on 07/22/2021 with immediate reconstruction with tissue expanders. 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  She had 0/5 lymph nodes removed.  She does not require radiation or anti estrogens.    Limitations House hold activities    Patient Stated Goals Post op reassessment. To have full reach and strength    Currently in Pain? No/denies    Multiple Pain Sites No                [facility] PT Assessment - 08/19/21 0001       Assessment   Medical Diagnosis Left Breast Cancer    Referring Provider (PT) Dr, Barry Dienes    Onset Date/Surgical Date 07/22/21    Hand Dominance Right      Precautions   Precaution Comments lymphedema risk on left      Balance Screen   Has the patient fallen in the past 6 months No    Has the patient had a  decrease in activity level because of a fear of falling?  No    Is the patient reluctant to leave their home because of a fear of falling?  No      Prior Function   Level of Independence Independent      Cognition   Overall Cognitive Status Within Functional Limits for tasks assessed      Observation/Other Assessments   Observations incisions all healing with several small scabs present throughout with very small area at left T incision that appears a little moist and not fully healed .  indentation noted at left where lymph node excision was.      Posture/Postural Control   Posture/Postural Control Postural limitations    Postural Limitations Rounded Shoulders;Forward head      AROM   Right Shoulder Extension 58 Degrees    Right Shoulder Flexion 150 Degrees    Right Shoulder ABduction 150 Degrees    Right  Shoulder External Rotation 105 Degrees    Left Shoulder Extension 46 Degrees    Left Shoulder Flexion 151 Degrees    Left Shoulder ABduction 152 Degrees    Left Shoulder External Rotation 107 Degrees               LYMPHEDEMA/ONCOLOGY QUESTIONNAIRE - 08/19/21 0001       Type   Cancer Type left breast Cancer      Surgeries   Mastectomy Date 07/22/21      Treatment   Active Chemotherapy Treatment No    Past Chemotherapy Treatment No    Active Radiation Treatment No    Past Radiation Treatment No    Current Hormone Treatment No    Past Hormone Therapy No      What other symptoms do you have   Are you Having Heaviness or Tightness No   chest after the recent fill   Are you having Pain No    Are you having pitting edema No    Is it Hard or Difficult finding clothes that fit No    Do you have infections No    Is there Decreased scar mobility Yes    Stemmer Sign No      Left Upper Extremity Lymphedema   10 cm Proximal to Olecranon Process 28.5 cm    Olecranon Process 23.9 cm    10 cm Proximal to Ulnar Styloid Process 19.5 cm    Just Proximal to Ulnar Styloid Process 15.2 cm    At Base of 2nd Digit 6.1 cm                Quick Dash - 08/19/21 0001     Open a tight or new jar Moderate difficulty    Do heavy household chores (wash walls, wash floors) Moderate difficulty    Carry a shopping bag or briefcase Mild difficulty    Wash your back Severe difficulty    Use a knife to cut food No difficulty    Recreational activities in which you take some force or impact through your arm, shoulder, or hand (golf, hammering, tennis) Unable    During the past week, to what extent has your arm, shoulder or hand problem interfered with your normal social activities with family, friends, neighbors, or groups? Not at all    During the past week, to what extent has your arm, shoulder or hand problem limited your work or other regular daily activities Not at all    Arm, shoulder,  or hand pain. None  Tingling (pins and needles) in your arm, shoulder, or hand Mild    Difficulty Sleeping No difficulty    DASH Score 29.55 %                                  PT Long Term Goals - 08/19/21 1112       PT LONG TERM GOAL #1   Title Pts Bilateral shoulder AROM will return to Pre-surgery levels    Time 6    Period Weeks    Status On-going    Target Date 09/30/21      PT LONG TERM GOAL #2   Title Pt will be independent with a HEP for shoulder ROM, strength, core strength    Time 6    Period Weeks    Status New    Target Date 09/30/21      PT LONG TERM GOAL #3   Title Pt will be independent in posture correction (ie decreased scapularprotraction, round shoulders)    Time 6    Period Weeks    Status New    Target Date 09/30/21      PT LONG TERM GOAL #4   Title pt will be independent in Long term strengthening program    Time 6    Status New    Target Date 09/30/21      PT LONG TERM GOAL #5   Title Bilateral shoulder abduction will be increased to 165    Time 3    Period Weeks    Status New    Target Date 09/09/21      Additional Long Term Goals   Additional Long Term Goals Yes      PT LONG TERM GOAL #6   Title Quick dash will be no greater than 12% for normal return to activities    Time 6    Period Weeks    Status New    Target Date 09/30/21                   Plan - 08/19/21 0857     Clinical Impression Statement Pt is s/p bilateral mastectomies with immediate reconstruction with tissue expanders on 07/22/2021 and with 0/5 LN's. She had her fill done on 08/13/2021 and will likely have her exchange done in the new year.  She will see Dr. Iran Planas tomorrow. She has a small area at the T junction of incision on the left that looks slightly damp and not quite fully healed.  Shoulder ROM is making good progress but is most limited with abduction.  She has tightness noted in bilateral pectorals and lats, and is posturing  with fairly significant rounded shoulders/scapular protraction. She resumed work this week part time and will go full time next week. She will benefit from soft tissue mobilization, PROM, core strength,postural education, with progression to UE strength when ready.    Personal Factors and Comorbidities Comorbidity 1    Examination-Activity Limitations Lift;Reach Overhead    Stability/Clinical Decision Making Stable/Uncomplicated    Clinical Decision Making Low    Rehab Potential Excellent    PT Frequency 2x / week    PT Duration 6 weeks    PT Treatment/Interventions ADLs/Self Care Home Management;Therapeutic exercise;Manual techniques;Orthotic Fit/Training;Patient/family education;Manual lymph drainage;Passive range of motion;Scar mobilization    PT Next Visit Plan STM pecs/ lats , scapular, Bilateral PROM, check T incision areas for healing, progress slowly to strength.  Wants  to learn Core exercises now.    Recommended Other Services [facility] scheduled    Consulted and Agree with Plan of Care Patient             Patient will benefit from skilled therapeutic intervention in order to improve the following deficits and impairments:  Decreased knowledge of precautions, Decreased skin integrity, Decreased strength, Impaired UE functional use, Decreased range of motion, Postural dysfunction, Decreased scar mobility  Visit Diagnosis: Ductal carcinoma in situ of left breast  Muscle weakness (generalized)  Stiffness of left shoulder, not elsewhere classified  Stiffness of right shoulder, not elsewhere classified     Problem List Patient Active Problem List   Diagnosis Date Noted   Breast cancer, left ([employer]) 11/00/3496   Monoallelic mutation of BARD1 gene 06/17/2021   Genetic testing 06/02/2021   Family history of cancer of gallbladder 05/29/2021   Family history of bladder cancer 05/29/2021   Ductal carcinoma in situ (DCIS) of left breast 05/20/2021    Claris Pong,  PT 08/19/2021, 11:35 AM  [facility] [place], [place], [address] Phone: [phone]   Fax:  [phone]  Name: Sarah Vincent MRN: [id number] Date of Birth: [birth year]

## 2021-08-29 ENCOUNTER — Ambulatory Visit: Payer: BC Managed Care – PPO

## 2021-08-29 ENCOUNTER — Other Ambulatory Visit: Payer: Self-pay

## 2021-08-29 DIAGNOSIS — M6281 Muscle weakness (generalized): Secondary | ICD-10-CM

## 2021-08-29 DIAGNOSIS — M25611 Stiffness of right shoulder, not elsewhere classified: Secondary | ICD-10-CM

## 2021-08-29 DIAGNOSIS — D0512 Intraductal carcinoma in situ of left breast: Secondary | ICD-10-CM

## 2021-08-29 DIAGNOSIS — M25612 Stiffness of left shoulder, not elsewhere classified: Secondary | ICD-10-CM

## 2021-08-29 NOTE — Therapy (Signed)
[place] [facility] @ [place], [place], [address] Phone: [phone]   Fax:  [phone]  Physical Therapy Treatment  Patient Details  Name: Sarah Vincent MRN: [id number] Date of Birth: 10/28/1970 Referring Provider (PT): Dr, Barry Dienes   Encounter Date: 08/29/2021   PT End of Session - 08/29/21 0905     Visit Number 3    Number of Visits 14    Date for PT Re-Evaluation 09/30/21    PT Start Time 0803    PT Stop Time 0903    PT Time Calculation (min) 60 min    Activity Tolerance Patient tolerated treatment well    Behavior During Therapy [facility] for tasks assessed/performed             Past Medical History:  Diagnosis Date   Acne    takes Spirolactone   Anxiety    Breast cancer ([place])    Left breast DCIS   Depression    Family history of bladder cancer    Family history of cancer of gallbladder    Restless leg syndrome    takes requip    Past Surgical History:  Procedure Laterality Date   BREAST RECONSTRUCTION WITH PLACEMENT OF TISSUE EXPANDER AND ALLODERM Bilateral 07/22/2021   Procedure: BILATERAL BREAST RECONSTRUCTION WITH PLACEMENT OF TISSUE EXPANDER AND ALLODERM;  Surgeon: Irene Limbo, MD;  Location: [facility];  Service: Plastics;  Laterality: Bilateral;   COLONOSCOPY     TOTAL MASTECTOMY Bilateral 07/22/2021   Procedure: BILATERAL TOTAL MASTECTOMY WITH LEFT SENTINEL LYMPH NODE BIOPSY;  Surgeon: Stark Klein, MD;  Location: [facility];  Service: General;  Laterality: Bilateral;    There were no vitals filed for this visit.   Subjective Assessment - 08/29/21 0804     Subjective The "T" part of my incision is still open but appears to be healing wel, just slowly. So I've been keeping htat in mind when I stretch.    Pertinent History Diagnosed with left breast Cancer  with yearly mammogram. Had bilateral mastectomies on 07/22/2021 with immediate reconstruction  with tissue expanders. 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  She had 0/5 lymph nodes removed.  She does not require radiation or anti estrogens.    Patient Stated Goals Post op reassessment. To have full reach and strength    Currently in Pain? No/denies                               [facility] Adult PT Treatment/Exercise - 08/29/21 0001       Lumbar Exercises: Supine   Pelvic Tilt 10 reps;5 seconds    Clam 10 reps   with pelvic tilt   Heel Slides 10 reps   with pelvic tilt   Bent Knee Raise 10 reps   with pelvic tilt   Other Supine Lumbar Exercises Reverse curl 10x, trial of candle but very challenging for pt      Manual Therapy   Manual Therapy Myofascial release;Passive ROM    Myofascial Release To Rt and then Lt axilla (cording noted in Lt) during P/ROM of each shoulder being mindful not to create pull at "T" intersection of incisions (Lt more so than Rt) but pt reported feeling no pull here during session    Passive ROM In Supine to Rt and then Lt shoulder into flexion, abduction and D2 to  pts tolerance                     PT Education - 08/29/21 0825     Education Details Supine core stability    Person(s) Educated Patient    Methods Explanation;Demonstration;Handout    Comprehension Verbalized understanding;Returned demonstration;Need further instruction                 PT Long Term Goals - 08/19/21 1112       PT LONG TERM GOAL #1   Title Pts Bilateral shoulder ROM will return to Pre-surgery levels    Time 6    Period Weeks    Status On-going    Target Date 09/30/21      PT LONG TERM GOAL #2   Title Pt will be independent with a HEP for shoulder ROM, strength, core strength    Time 6    Period Weeks    Status New    Target Date 09/30/21      PT LONG TERM GOAL #3   Title Pt will be independent in posture correction (ie decreased scapularprotraction, round shoulders)    Time 6     Period Weeks    Status New    Target Date 09/30/21      PT LONG TERM GOAL #4   Title pt will be independent in Long term strengthening program    Time 6    Status New    Target Date 09/30/21      PT LONG TERM GOAL #5   Title Bilateral shoulder abduction will be increased to 165    Time 3    Period Weeks    Status New    Target Date 09/09/21      Additional Long Term Goals   Additional Long Term Goals Yes      PT LONG TERM GOAL #6   Title Quick dash will be no greater than 12% for normal return to activities    Time 6    Period Weeks    Status New    Target Date 09/30/21                   Plan - 08/29/21 1235     Clinical Impression Statement Progressed pt to include core exercises which she was challenged by but tolerated well. Then began bil UE P/ROM with MFR to axillae accordingly. Pt struggled with relaxing due to muscle guarding but was able to do so with cuing. Cording was noted in her Lt axilla so educated her about this during MT and answered pts questions. Overall she reports feeling much looser by end of session. Also encouraged her to work on stretching into end ROM of bil shoulders since this did not create any pull on inferior aspects of incisions and they area both scabbed over and beginning to close. Pt seemed encouraged by this knowing it is okay to now push into her stretches a little more than she has been.    Personal Factors and Comorbidities Comorbidity 1    Examination-Activity Limitations Lift;Reach Overhead    Stability/Clinical Decision Making Stable/Uncomplicated    Rehab Potential Excellent    PT Frequency 2x / week    PT Duration 6 weeks    PT Treatment/Interventions ADLs/Self Care Home Management;Therapeutic exercise;Manual techniques;Orthotic Fit/Training;Patient/family education;Manual lymph drainage;Passive range of motion;Scar mobilization    PT Next Visit Plan STM pecs/ lats, MFR to cording in Lt axilla , scapular, Bilateral PROM, check  T  incision areas for healing, progress slowly to strength.  Reivew core exercises and progress as able    PT Home Exercise Plan 4 post op exercises to start after OK by MD    Consulted and Agree with Plan of Care Patient             Patient will benefit from skilled therapeutic intervention in order to improve the following deficits and impairments:  Decreased knowledge of precautions, Decreased skin integrity, Decreased strength, Impaired UE functional use, Decreased range of motion, Postural dysfunction, Decreased scar mobility  Visit Diagnosis: Ductal carcinoma in situ of left breast  Muscle weakness (generalized)  Stiffness of left shoulder, not elsewhere classified  Stiffness of right shoulder, not elsewhere classified     Problem List Patient Active Problem List   Diagnosis Date Noted   Breast cancer, left ([place]) 82/99/3716   Monoallelic mutation of BARD1 gene 06/17/2021   Genetic testing 06/02/2021   Family history of cancer of gallbladder 05/29/2021   Family history of bladder cancer 05/29/2021   Ductal carcinoma in situ (DCIS) of left breast 05/20/2021    Otelia Limes, PTA 08/29/2021, 12:42 PM  [place] @ [place], [place], [address] Phone: [phone]   Fax:  [phone]  Name: Kathlyn Leachman MRN: [id number] Date of Birth: 27-Sep-1971

## 2021-08-29 NOTE — Patient Instructions (Signed)
Access Code: [email or web address] URL: [email or web address] Date: 08/29/2021 Prepared by: Collie Siad  Exercises Supine Posterior Pelvic Tilt - 1 x daily - 7 x weekly - 1 sets - 10 reps - 5 hold Supine March with Posterior Pelvic Tilt - 1 x daily - 7 x weekly - 1 sets - 10 reps Supine Pelvic Tilt with Straight Leg Raise - 1 x daily - 7 x weekly - 1 sets - 10 reps Reverse Crunch - 1 x daily - 5 x weekly - 1 sets - 10 reps

## 2021-09-03 ENCOUNTER — Encounter: Payer: Self-pay | Admitting: Rehabilitation

## 2021-09-03 ENCOUNTER — Ambulatory Visit: Payer: BC Managed Care – PPO | Admitting: Rehabilitation

## 2021-09-03 ENCOUNTER — Other Ambulatory Visit: Payer: Self-pay

## 2021-09-03 DIAGNOSIS — M25611 Stiffness of right shoulder, not elsewhere classified: Secondary | ICD-10-CM

## 2021-09-03 DIAGNOSIS — M25612 Stiffness of left shoulder, not elsewhere classified: Secondary | ICD-10-CM

## 2021-09-03 DIAGNOSIS — M6281 Muscle weakness (generalized): Secondary | ICD-10-CM

## 2021-09-03 DIAGNOSIS — D0512 Intraductal carcinoma in situ of left breast: Secondary | ICD-10-CM

## 2021-09-03 NOTE — Patient Instructions (Signed)
Access Code: [email or web address]: [email or web address]: 11/16/2022Prepared by: Marcene Brawn TevisExercises  Standing Row with Anchored Resistance - 1 x daily - 5 x weekly - 1-3 sets - 10 reps - 2-3 second hold  Single Arm Shoulder Extension with Anchored Resistance - 1 x daily - 5 x weekly - 1-3 sets - 10 reps - 2-3 hold  Seated Bilateral Shoulder External Rotation with Resistance - 1 x daily - 5 x weekly - 1-3 sets - 10 reps - 2-3 hold

## 2021-09-03 NOTE — Therapy (Signed)
[place] @ [place], [place], [address] Phone: [phone]   Fax:  [phone]  Physical Therapy Treatment  Patient Details  Name: Sarah Vincent MRN: [id number] Date of Birth: 1970/11/12 Referring Provider (PT): Dr, Barry Dienes   Encounter Date: 09/03/2021   PT End of Session - 09/03/21 0847     Visit Number 4    Number of Visits 14    Date for PT Re-Evaluation 09/30/21    PT Start Time 0805    PT Stop Time 2774    PT Time Calculation (min) 42 min    Activity Tolerance Patient tolerated treatment well    Behavior During Therapy [facility] for tasks assessed/performed             Past Medical History:  Diagnosis Date   Acne    takes Spirolactone   Anxiety    Breast cancer ([place])    Left breast DCIS   Depression    Family history of bladder cancer    Family history of cancer of gallbladder    Restless leg syndrome    takes requip    Past Surgical History:  Procedure Laterality Date   BREAST RECONSTRUCTION WITH PLACEMENT OF TISSUE EXPANDER AND ALLODERM Bilateral 07/22/2021   Procedure: BILATERAL BREAST RECONSTRUCTION WITH PLACEMENT OF TISSUE EXPANDER AND ALLODERM;  Surgeon: Irene Limbo, MD;  Location: [facility];  Service: Plastics;  Laterality: Bilateral;   COLONOSCOPY     TOTAL MASTECTOMY Bilateral 07/22/2021   Procedure: BILATERAL TOTAL MASTECTOMY WITH LEFT SENTINEL LYMPH NODE BIOPSY;  Surgeon: Stark Klein, MD;  Location: [facility];  Service: General;  Laterality: Bilateral;    There were no vitals filed for this visit.   Subjective Assessment - 09/03/21 0808     Subjective Still open and now the other one is open.    Pertinent History Diagnosed with left breast Cancer  with yearly mammogram. Had bilateral mastectomies on 07/22/2021 with immediate reconstruction with tissue expanders. 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  Genetic testing revealed positive BARD 1  mutation.  She had 0/5 lymph nodes removed.  She does not require radiation or anti estrogens.    Currently in Pain? No/denies                               [facility] Adult PT Treatment/Exercise - 09/03/21 0001       Exercises   Exercises Shoulder      Shoulder Exercises: Seated   External Rotation Both;10 reps    Theraband Level (Shoulder External Rotation) Level 1 (Yellow)      Shoulder Exercises: Standing   Extension Both;10 reps    Theraband Level (Shoulder Extension) Level 1 (Yellow)    Row Both;10 reps    Theraband Level (Shoulder Row) Level 1 (Yellow)      Manual Therapy   Manual therapy comments Pt has new open spot on Lt side - checked for pulling with all PROM and still seems to be none.    Myofascial Release To Rt and then Lt axilla (cording noted in Lt) during P/ROM of each shoulder being mindful not to create pull at "T" intersection of incisions (Lt more so than Rt) but pt reported feeling no pull here during session    Passive ROM In Supine to Rt and then Lt shoulder into flexion, abduction and D2 to pts tolerance  PT Long Term Goals - 08/19/21 1112       PT LONG TERM GOAL #1   Title Pts Bilateral shoulder ROM will return to Pre-surgery levels    Time 6    Period Weeks    Status On-going    Target Date 09/30/21      PT LONG TERM GOAL #2   Title Pt will be independent with a HEP for shoulder ROM, strength, core strength    Time 6    Period Weeks    Status New    Target Date 09/30/21      PT LONG TERM GOAL #3   Title Pt will be independent in posture correction (ie decreased scapularprotraction, round shoulders)    Time 6    Period Weeks    Status New    Target Date 09/30/21      PT LONG TERM GOAL #4   Title pt will be independent in Long term strengthening program    Time 6    Status New    Target Date 09/30/21      PT LONG TERM GOAL #5   Title Bilateral shoulder abduction will be  increased to 165    Time 3    Period Weeks    Status New    Target Date 09/09/21      Additional Long Term Goals   Additional Long Term Goals Yes      PT LONG TERM GOAL #6   Title Quick dash will be no greater than 12% for normal return to activities    Time 6    Period Weeks    Status New    Target Date 09/30/21                   Plan - 09/03/21 0848     Clinical Impression Statement Pt with continued T junction opening on the Rt and now on the left.  Small circle pimple sized spot bilaterally with very mild yellow pus.  Still does not seem to pull with PROM but limited AAROM or vigorous stretches.  Added standing Theraband exercise which was tolerated well and added to HEP.  Cancelled back to back appt tomorrow as pt is doing very well.    PT Frequency 2x / week    PT Duration 6 weeks    PT Treatment/Interventions ADLs/Self Care Home Management;Therapeutic exercise;Manual techniques;Orthotic Fit/Training;Patient/family education;Manual lymph drainage;Passive range of motion;Scar mobilization    PT Next Visit Plan STM pecs/ lats, MFR to cording in Lt axilla ,Bilateral PROM, check T incision areas for healing, progress slowly to strength.  Reivew core exercises and progress as able    PT Home Exercise Plan 4 post op exercises to start after OK by MD, Access Code: [email or web address]    Consulted and Agree with Plan of Care Patient             Patient will benefit from skilled therapeutic intervention in order to improve the following deficits and impairments:     Visit Diagnosis: Ductal carcinoma in situ of left breast  Muscle weakness (generalized)  Stiffness of left shoulder, not elsewhere classified  Stiffness of right shoulder, not elsewhere classified     Problem List Patient Active Problem List   Diagnosis Date Noted   Breast cancer, left ([place]) 00/34/9179   Monoallelic mutation of BARD1 gene 06/17/2021   Genetic testing 06/02/2021   Family history of cancer  of gallbladder 05/29/2021   Family history of bladder cancer 05/29/2021  Ductal carcinoma in situ (DCIS) of left breast 05/20/2021    Stark Bray, PT 09/03/2021, 8:50 AM  [facility] [place] @ [place], [place], [address] Phone: [phone]   Fax:  [phone]  Name: Sarah Vincent MRN: [id number] Date of Birth: 1970-11-11

## 2021-09-09 ENCOUNTER — Ambulatory Visit: Payer: BC Managed Care – PPO

## 2021-09-09 ENCOUNTER — Other Ambulatory Visit: Payer: Self-pay

## 2021-09-09 DIAGNOSIS — D0512 Intraductal carcinoma in situ of left breast: Secondary | ICD-10-CM

## 2021-09-09 DIAGNOSIS — M6281 Muscle weakness (generalized): Secondary | ICD-10-CM

## 2021-09-09 DIAGNOSIS — M25612 Stiffness of left shoulder, not elsewhere classified: Secondary | ICD-10-CM

## 2021-09-09 DIAGNOSIS — M25611 Stiffness of right shoulder, not elsewhere classified: Secondary | ICD-10-CM

## 2021-09-09 NOTE — Therapy (Signed)
[place] @ [place], [place], [address] Phone: [phone]   Fax:  [phone]  Physical Therapy Treatment  Patient Details  Name: Sarah Vincent MRN: [id number] Date of Birth: Feb 28, 1971 Referring Provider (PT): Dr, Barry Dienes   Encounter Date: 09/09/2021   PT End of Session - 09/09/21 1647     Visit Number 5    Number of Visits 14    Date for PT Re-Evaluation 09/30/21    PT Start Time 9470    PT Stop Time 1654    PT Time Calculation (min) 49 min    Activity Tolerance Patient tolerated treatment well    Behavior During Therapy [facility] for tasks assessed/performed             Past Medical History:  Diagnosis Date   Acne    takes Spirolactone   Anxiety    Breast cancer ([place])    Left breast DCIS   Depression    Family history of bladder cancer    Family history of cancer of gallbladder    Restless leg syndrome    takes requip    Past Surgical History:  Procedure Laterality Date   BREAST RECONSTRUCTION WITH PLACEMENT OF TISSUE EXPANDER AND ALLODERM Bilateral 07/22/2021   Procedure: BILATERAL BREAST RECONSTRUCTION WITH PLACEMENT OF TISSUE EXPANDER AND ALLODERM;  Surgeon: Irene Limbo, MD;  Location: [facility];  Service: Plastics;  Laterality: Bilateral;   COLONOSCOPY     TOTAL MASTECTOMY Bilateral 07/22/2021   Procedure: BILATERAL TOTAL MASTECTOMY WITH LEFT SENTINEL LYMPH NODE BIOPSY;  Surgeon: Stark Klein, MD;  Location: [facility];  Service: General;  Laterality: Bilateral;    There were no vitals filed for this visit.   Subjective Assessment - 09/09/21 1605     Subjective Incisions are doing well.  They aren't open anymore and are healing. Being cautious with exercises not to reopen them. Area of cording still feels bruised but it doesn't seem to limit ROM on left.  I have reconstruction surgery on Dec 30.    Pertinent History Diagnosed with left breast  Cancer  with yearly mammogram. Had bilateral mastectomies on 07/22/2021 with immediate reconstruction with tissue expanders. 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  She had 0/5 lymph nodes removed.  She does not require radiation or anti estrogens.    Patient Stated Goals Post op reassessment. To have full reach and strength    Currently in Pain? No/denies    Multiple Pain Sites No                [facility] PT Assessment - 09/09/21 0001       Assessment   Medical Diagnosis Left Breast Cancer    Referring Provider (PT) Dr, Barry Dienes    Onset Date/Surgical Date 07/22/21    Hand Dominance Right      Precautions   Precaution Comments lymphedema risk on left                           [facility] Adult PT Treatment/Exercise - 09/09/21 0001       Shoulder Exercises: Supine   Other Supine Exercises AROM supine flex, scaption, horizontal abd x 5 ea      Shoulder Exercises: Standing   External Rotation Strengthening;Both;10 reps    Theraband Level (Shoulder External Rotation) Level 1 (Yellow)    Extension Both;10 reps  Theraband Level (Shoulder Extension) Level 1 (Yellow)    Row Both;10 reps    Theraband Level (Shoulder Row) Level 1 (Yellow)      Manual Therapy   Manual Therapy Soft tissue mobilization;Myofascial release;Passive ROM    Manual therapy comments incisions both healed. no pulling noted with PROM    Soft tissue mobilization soft tissue mobilization to bilateral pecs, lats, UT's in supine    Myofascial Release to left area of cording and bilateral axilla    Passive ROM to bilateral shoulder flexion, scaption, IR, ER                          PT Long Term Goals - 08/19/21 1112       PT LONG TERM GOAL #1   Title Pts Bilateral shoulder ROM will return to Pre-surgery levels    Time 6    Period Weeks    Status On-going    Target Date 09/30/21      PT LONG TERM GOAL #2   Title Pt will be independent  with a HEP for shoulder ROM, strength, core strength    Time 6    Period Weeks    Status New    Target Date 09/30/21      PT LONG TERM GOAL #3   Title Pt will be independent in posture correction (ie decreased scapularprotraction, round shoulders)    Time 6    Period Weeks    Status New    Target Date 09/30/21      PT LONG TERM GOAL #4   Title pt will be independent in Long term strengthening program    Time 6    Status New    Target Date 09/30/21      PT LONG TERM GOAL #5   Title Bilateral shoulder abduction will be increased to 165    Time 3    Period Weeks    Status New    Target Date 09/09/21      Additional Long Term Goals   Additional Long Term Goals Yes      PT LONG TERM GOAL #6   Title Quick dash will be no greater than 12% for normal return to activities    Time 6    Period Weeks    Status New    Target Date 09/30/21                   Plan - 09/09/21 1657     Clinical Impression Statement T junction incisions now healed and no pulling was noted with ROM activities.  Pt experienced some posterior right shoulder pain with PROM scaption and flexion at end ranges.  Did very well with theraband and AROM activities today    Personal Factors and Comorbidities Comorbidity 1    Examination-Activity Limitations Lift;Reach Overhead    Stability/Clinical Decision Making Stable/Uncomplicated    Rehab Potential Excellent    PT Frequency 2x / week    PT Duration 6 weeks    PT Treatment/Interventions ADLs/Self Care Home Management;Therapeutic exercise;Manual techniques;Orthotic Fit/Training;Patient/family education;Manual lymph drainage;Passive range of motion;Scar mobilization    PT Next Visit Plan STM pecs/ lats, MFR to cording in Lt axilla ,Bilateral PROM, check T incision areas for healing, progress slowly to strength.  Reivew core exercises and progress as able    PT Home Exercise Plan 4 post op exercises to start after OK by MD, Access Code: [email or web address]     Consulted and  Agree with Plan of Care Patient             Patient will benefit from skilled therapeutic intervention in order to improve the following deficits and impairments:  Decreased knowledge of precautions, Decreased skin integrity, Decreased strength, Impaired UE functional use, Decreased range of motion, Postural dysfunction, Decreased scar mobility  Visit Diagnosis: Ductal carcinoma in situ of left breast  Muscle weakness (generalized)  Stiffness of left shoulder, not elsewhere classified  Stiffness of right shoulder, not elsewhere classified     Problem List Patient Active Problem List   Diagnosis Date Noted   Breast cancer, left ([place]) 90/30/0923   Monoallelic mutation of BARD1 gene 06/17/2021   Genetic testing 06/02/2021   Family history of cancer of gallbladder 05/29/2021   Family history of bladder cancer 05/29/2021   Ductal carcinoma in situ (DCIS) of left breast 05/20/2021    Claris Pong, PT 09/09/2021, 5:01 PM  [place] @ [place], [place], [address] Phone: [phone]   Fax:  [phone]  Name: Sarah Vincent MRN: [id number] Date of Birth: 18-Aug-1971

## 2021-09-16 ENCOUNTER — Ambulatory Visit: Payer: BC Managed Care – PPO

## 2021-09-16 ENCOUNTER — Other Ambulatory Visit: Payer: Self-pay

## 2021-09-16 DIAGNOSIS — M25612 Stiffness of left shoulder, not elsewhere classified: Secondary | ICD-10-CM

## 2021-09-16 DIAGNOSIS — M6281 Muscle weakness (generalized): Secondary | ICD-10-CM

## 2021-09-16 DIAGNOSIS — D0512 Intraductal carcinoma in situ of left breast: Secondary | ICD-10-CM | POA: Diagnosis not present

## 2021-09-16 DIAGNOSIS — M25611 Stiffness of right shoulder, not elsewhere classified: Secondary | ICD-10-CM

## 2021-09-16 NOTE — Patient Instructions (Signed)
Side Pull: Double Arm   On back, knees bent, feet flat. Arms perpendicular to body, shoulder level, elbows straight but relaxed. Pull arms out to sides, elbows straight. Resistance band comes across collarbones, hands toward floor. Hold momentarily. Slowly return to starting position. Repeat _5-10__ times. Band color _yellow____   Sword     On back, knees bent, feet flat, elbows tucked at sides, bent 90, hands palms up. Pull hands apart and down toward floor, keeping elbows near sides. Hold momentarily. Slowly return to starting position. Repeat _5-10__ times. Band color __yellow____

## 2021-09-16 NOTE — Therapy (Signed)
[facility] @ [place], [place], [address] Phone: [phone]   Fax:  [phone]  Physical Therapy Treatment  Patient Details  Name: Sarah Vincent MRN: [id number] Date of Birth: 06-03-1971 Referring Provider (PT): Dr, Barry Dienes   Encounter Date: 09/16/2021   PT End of Session - 09/16/21 1654     Visit Number 6    Number of Visits 14    Date for PT Re-Evaluation 09/30/21    PT Start Time 1602    PT Stop Time 3614    PT Time Calculation (min) 52 min             Past Medical History:  Diagnosis Date   Acne    takes Spirolactone   Anxiety    Breast cancer ([place])    Left breast DCIS   Depression    Family history of bladder cancer    Family history of cancer of gallbladder    Restless leg syndrome    takes requip    Past Surgical History:  Procedure Laterality Date   BREAST RECONSTRUCTION WITH PLACEMENT OF TISSUE EXPANDER AND ALLODERM Bilateral 07/22/2021   Procedure: BILATERAL BREAST RECONSTRUCTION WITH PLACEMENT OF TISSUE EXPANDER AND ALLODERM;  Surgeon: Irene Limbo, MD;  Location: [facility];  Service: Plastics;  Laterality: Bilateral;   COLONOSCOPY     TOTAL MASTECTOMY Bilateral 07/22/2021   Procedure: BILATERAL TOTAL MASTECTOMY WITH LEFT SENTINEL LYMPH NODE BIOPSY;  Surgeon: Stark Klein, MD;  Location: [facility];  Service: General;  Laterality: Bilateral;    There were no vitals filed for this visit.   Subjective Assessment - 09/16/21 1605     Subjective Incisions are doing really good.  They are dried up with a flaky scab. Right shoulder still feels stiffer. Still have some trouble taking sweat shirts off over my head.    Pertinent History Diagnosed with left breast Cancer  with yearly mammogram. Had bilateral mastectomies on 07/22/2021 with immediate reconstruction with tissue expanders. 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  She had 0/5 lymph nodes removed.  She does not require radiation or anti estrogens.    Patient Stated Goals Post op reassessment. To have full reach and strength    Currently in Pain? No/denies    Multiple Pain Sites Yes                [facility] PT Assessment - 09/16/21 0001       AROM   Right Shoulder Flexion 160 Degrees    Right Shoulder ABduction 178 Degrees    Left Shoulder Flexion 168 Degrees    Left Shoulder ABduction 180 Degrees                           [facility] Adult PT Treatment/Exercise - 09/16/21 0001       Shoulder Exercises: Supine   Horizontal ABduction Strengthening;Both;10 reps    Theraband Level (Shoulder Horizontal ABduction) Level 1 (Yellow)    Diagonals Strengthening;Right;Left;5 reps    Theraband Level (Shoulder Diagonals) Level 1 (Yellow)    Other Supine Exercises AAROM flexion/scaption x 5 with dowel      Manual Therapy   Manual therapy comments incisions both healed. no pulling noted with PROM    Soft tissue mobilization soft tissue mobilization to bilateral pecs, lats, UT's/scapular area in supine and SL    Passive ROM to  bilateral shoulder flexion, scaption, IR, ER                     PT Education - 09/16/21 1653     Education Details instructed supine horizontal abduction x10 with yellow, and diagonals with thumb up x 5 ea with yellow    Person(s) Educated Patient    Methods Demonstration;Handout    Comprehension Returned demonstration                 PT Long Term Goals - 09/16/21 1659       PT LONG TERM GOAL #1   Title Pts Bilateral shoulder ROM will return to Pre-surgery levels    Baseline met on left, nearly met on right    Time 6    Period Weeks    Status Partially Met      PT LONG TERM GOAL #2   Title Pt will be independent with a HEP for shoulder ROM, strength, core strength    Time 6    Period Weeks    Status Achieved      PT LONG TERM GOAL #3   Title Pt will be  independent in posture correction (ie decreased scapularprotraction, round shoulders)    Time 6    Period Weeks    Status On-going      PT LONG TERM GOAL #4   Title pt will be independent in Long term strengthening program    Time 6    Period Weeks    Status On-going      PT LONG TERM GOAL #5   Title Bilateral shoulder abduction will be increased to 165    Time 3    Period Weeks    Status Achieved      PT LONG TERM GOAL #6   Title Quick dash will be no greater than 12% for normal return to activities    Time 6    Period Weeks    Status On-going                   Plan - 09/16/21 1655     Clinical Impression Statement Incisions looking good and healing in better.  Pt continues to experience some mild right shoulder pain posteriorlu with A/PROM flexion and scaption at end ranges but can prevent with scapular depression.  Left shoulder ROM now WNL, and right within 3 degrees of shoulder flexion and 2 degrees of abduction. cording in left axilla no longer visible or palpable    Personal Factors and Comorbidities Comorbidity 1    Examination-Activity Limitations Lift;Reach Overhead    Stability/Clinical Decision Making Stable/Uncomplicated    Rehab Potential Excellent    PT Frequency 2x / week    PT Duration 6 weeks    PT Treatment/Interventions ADLs/Self Care Home Management;Therapeutic exercise;Manual techniques;Orthotic Fit/Training;Patient/family education;Manual lymph drainage;Passive range of motion;Scar mobilization    PT Next Visit Plan STM pecs/ lats, Bilateral PROM prn, check T incision areas for healing, progress slowly to strength.  Reivew core exercises and progress as able    PT Home Exercise Plan 4 post op exercises to start after OK by MD, Access Code: [email or web address]    Recommended Other Services pt given script for prophylactic compression sleeve and gauntlet    Consulted and Agree with Plan of Care Patient             Patient will benefit from skilled  therapeutic intervention in order to improve the following deficits and impairments:  Decreased  knowledge of precautions, Decreased skin integrity, Decreased strength, Impaired UE functional use, Decreased range of motion, Postural dysfunction, Decreased scar mobility  Visit Diagnosis: Ductal carcinoma in situ of left breast  Muscle weakness (generalized)  Stiffness of left shoulder, not elsewhere classified  Stiffness of right shoulder, not elsewhere classified     Problem List Patient Active Problem List   Diagnosis Date Noted   Breast cancer, left ([facility]) 37/54/3606   Monoallelic mutation of BARD1 gene 06/17/2021   Genetic testing 06/02/2021   Family history of cancer of gallbladder 05/29/2021   Family history of bladder cancer 05/29/2021   Ductal carcinoma in situ (DCIS) of left breast 05/20/2021    Claris Pong, PT 09/16/2021, 5:02 PM  [facility] @ [place], [place], [address] Phone: [phone]   Fax:  [phone]  Name: Sarah Vincent MRN: [id number] Date of Birth: [birth year]

## 2021-09-23 ENCOUNTER — Other Ambulatory Visit: Payer: Self-pay

## 2021-09-23 ENCOUNTER — Ambulatory Visit: Payer: BC Managed Care – PPO | Attending: General Surgery

## 2021-09-23 DIAGNOSIS — D0512 Intraductal carcinoma in situ of left breast: Secondary | ICD-10-CM | POA: Insufficient documentation

## 2021-09-23 DIAGNOSIS — M25611 Stiffness of right shoulder, not elsewhere classified: Secondary | ICD-10-CM | POA: Insufficient documentation

## 2021-09-23 DIAGNOSIS — M25612 Stiffness of left shoulder, not elsewhere classified: Secondary | ICD-10-CM | POA: Insufficient documentation

## 2021-09-23 DIAGNOSIS — M6281 Muscle weakness (generalized): Secondary | ICD-10-CM | POA: Diagnosis present

## 2021-09-23 NOTE — Therapy (Signed)
[facility] @ [address] [place], [place], [address] Phone: [phone]   Fax:  [phone]  Physical Therapy Treatment  Patient Details  Name: Sarah Vincent MRN: [id number] Date of Birth: [birth year] Referring Provider (PT): Dr, Barry Dienes   Encounter Date: 09/23/2021   PT End of Session - 09/23/21 1706     Visit Number 7    Number of Visits 14    Date for PT Re-Evaluation 09/30/21    PT Start Time 1602    PT Stop Time 1700    PT Time Calculation (min) 58 min    Activity Tolerance Patient tolerated treatment well    Behavior During Therapy [facility] for tasks assessed/performed             Past Medical History:  Diagnosis Date   Acne    takes Spirolactone   Anxiety    Breast cancer ([facility])    Left breast DCIS   Depression    Family history of bladder cancer    Family history of cancer of gallbladder    Restless leg syndrome    takes requip    Past Surgical History:  Procedure Laterality Date   BREAST RECONSTRUCTION WITH PLACEMENT OF TISSUE EXPANDER AND ALLODERM Bilateral 07/22/2021   Procedure: BILATERAL BREAST RECONSTRUCTION WITH PLACEMENT OF TISSUE EXPANDER AND ALLODERM;  Surgeon: Irene Limbo, MD;  Location: [facility];  Service: Plastics;  Laterality: Bilateral;   COLONOSCOPY     TOTAL MASTECTOMY Bilateral 07/22/2021   Procedure: BILATERAL TOTAL MASTECTOMY WITH LEFT SENTINEL LYMPH NODE BIOPSY;  Surgeon: Stark Klein, MD;  Location: [facility];  Service: General;  Laterality: Bilateral;    There were no vitals filed for this visit.   Subjective Assessment - 09/23/21 1602     Subjective Incisions are still doing well. I have tightness still in my Right shoulder with reaching back. I woke up a week and a half ago and the left side of my neck was hurting and it still hurts.  Feels tight when I SB to the right, but its painful to go to the left. I have my appt for the  sleeve tomorrow and my pre-op on Thursday for expander exchange.    Pertinent History Diagnosed with left breast Cancer  with yearly mammogram. Had bilateral mastectomies on 07/22/2021 with immediate reconstruction with tissue expanders. 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  She had 0/5 lymph nodes removed.  She does not require radiation or anti estrogens.    Limitations House hold activities    Patient Stated Goals Post op reassessment. To have full reach and strength    Currently in Pain? Yes    Pain Score 5    with movement turning or SB to the left   Pain Location Neck    Pain Orientation Left    Pain Descriptors / Indicators Tightness;Discomfort;Sharp    Pain Type Acute pain    Pain Onset 1 to 4 weeks ago    Pain Frequency Intermittent    Multiple Pain Sites No                               [facility] Adult PT Treatment/Exercise - 09/23/21 0001       Shoulder Exercises: Seated   Other Seated Exercises Cervical retraction x 10      Shoulder Exercises: Standing  Other Standing Exercises pec wall stretch x3    Other Standing Exercises IR with strap x 3, 15 sec      Manual Therapy   Manual Therapy Soft tissue mobilization;Passive ROM;Manual Traction    Soft tissue mobilization Bilateral UT, levator, posterior cervicals, suboccipitals, right pectorals and lats, manual traction, suboccipital release, Right posterior shoulder Soft tissue mobilization   Passive ROM PROM right shoulder flexion, scaption, abd, IR,ER                    PT Education - 09/23/21 1705     Education Details Cervical retractions/IR stretch with strap    Person(s) Educated Patient    Methods Demonstration;Handout    Comprehension Returned demonstration                 PT Long Term Goals - 09/16/21 1659       PT LONG TERM GOAL #1   Title Pts Bilateral shoulder ROM will return to Pre-surgery levels    Baseline met on left,  nearly met on right    Time 6    Period Weeks    Status Partially Met      PT LONG TERM GOAL #2   Title Pt will be independent with a HEP for shoulder ROM, strength, core strength    Time 6    Period Weeks    Status Achieved      PT LONG TERM GOAL #3   Title Pt will be independent in posture correction (ie decreased scapularprotraction, round shoulders)    Time 6    Period Weeks    Status On-going      PT LONG TERM GOAL #4   Title pt will be independent in Long term strengthening program    Time 6    Period Weeks    Status On-going      PT LONG TERM GOAL #5   Title Bilateral shoulder abduction will be increased to 165    Time 3    Period Weeks    Status Achieved      PT LONG TERM GOAL #6   Title Quick dash will be no greater than 12% for normal return to activities    Time 6    Period Weeks    Status On-going                   Plan - 09/23/21 1712     Clinical Impression Statement Pt came in with continued complaints of left neck pain present for about a week.  Performed soft tissue mobilization to bilateral UT/levator/posterior cervicals , and right pectorals and lats, in addition to trial of manual traction and suboccipital release techniques. PROM was performed to right shoulder.  Pts. neck felt much better after treatment.  She continued to have sensation of pain in the right posterior shoulder at end ranges of ROM, but is non tender. Her HEP was updated with cervical retractions and IR with strap.    Examination-Activity Limitations Lift;Reach Overhead    Stability/Clinical Decision Making Stable/Uncomplicated    Rehab Potential Excellent    PT Frequency 2x / week    PT Duration 6 weeks    PT Treatment/Interventions ADLs/Self Care Home Management;Therapeutic exercise;Manual techniques;Orthotic Fit/Training;Patient/family education;Manual lymph drainage;Passive range of motion;Scar mobilization    PT Next Visit Plan How is left neck doing and right  shoulder?, progress to strength/stability esp for right shoulder, STM PROM prn    PT Home Exercise Plan 4 post  op exercises to start after OK by MD, Access Code: [email or web address]    Recommended Other Services has appt 12/7    Consulted and Agree with Plan of Care Patient             Patient will benefit from skilled therapeutic intervention in order to improve the following deficits and impairments:  Decreased knowledge of precautions, Decreased skin integrity, Decreased strength, Impaired UE functional use, Decreased range of motion, Postural dysfunction, Decreased scar mobility  Visit Diagnosis: Ductal carcinoma in situ of left breast  Muscle weakness (generalized)  Stiffness of left shoulder, not elsewhere classified  Stiffness of right shoulder, not elsewhere classified     Problem List Patient Active Problem List   Diagnosis Date Noted   Breast cancer, left ([facility]) 33/61/2244   Monoallelic mutation of BARD1 gene 06/17/2021   Genetic testing 06/02/2021   Family history of cancer of gallbladder 05/29/2021   Family history of bladder cancer 05/29/2021   Ductal carcinoma in situ (DCIS) of left breast 05/20/2021    Claris Pong, PT 09/23/2021, 5:20 PM  [facility] @ [place], [place], [address] Phone: [phone]   Fax:  [phone]  Name: Sarah Vincent MRN: [id number] Date of Birth: 1971-07-20

## 2021-09-23 NOTE — Patient Instructions (Signed)
Access Code: [email or web address] URL: [email or web address] Date: 09/23/2021 Prepared by: Cheral Almas  Exercises Standing Cervical Retraction - 1 x daily - 7 x weekly - 3 sets - 10 reps Standing Shoulder Internal Rotation Stretch with Towel - 1 x daily - 7 x weekly - 3 sets - 10 reps

## 2021-09-25 NOTE — H&P (Signed)
Subjective:     Patient ID: Sarah Vincent is a 50 y.o. female.     2 months post op. Scheduled for implant exchange end of this month.   Presented following screening MMG with left breast calcifications. Diagnostic MMG/US showed left breast calcifications spanning up to 1.8 cm. Biopsy showed high-grade DCIS with necrosis and calcifications, focally suspicious for microinvasion ER/PR+. [place] axilla negative.   MRI demonstrated post biopsy changes with surrounding enhancement in the lateral left breast measuring 4.6 cm. In the left axilla there were approximately 7 LN, some of which appear to have cortical thickening. Additional MR guided biopsy of enhancement left breast UOQ with high grade DCIS.   Final pathology 0.1 cm focus microinvasive ductal ca, high grade DCIS with necrosis, margins clear, 0/5 SLN. Seen by Oncology no hormonal treatment recommended, 3 month follow up recommended.   Genetics monoallelic mutation in [place].   Prior 6 D. 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   Lives with spouse and 2 teenage boys.   Review of Systems      Objective:   Physical Exam Cardiovascular:     Rate and Rhythm: Normal rate and regular rhythm.     Heart sounds: Normal heart sounds.  Pulmonary:     Effort: Pulmonary effort is normal.     Breath sounds: Normal breath sounds.     Chest:   bilateral chest expanded   Abd: soft no hernias    Assessment:     DCIS left breast BARD1 mutation S/p bilateral SRM, left SLN, prepectoral TE/ADM (Alloderm) reconstruction    Plan:     Plan implant exchange and lipofilling bilateral chest.   Reviewed saline vs silicone, shaped vs round, textured vs smooth. Recommend HCG or capacity filled silicone implants as they may offer reduced risk visible rippling. Reviewed MRI or [place] surveillance for rupture with silicone implants. Reviewed risks BIA ALCL with textured devices, new  reports SCC implant associated with breast implants. Reviewed shared risks rupture contracture need for additional surgery. Counseled implants are not permanent devices. Reviewed size in part guided by width chest, cannot assure her cup size. Patient has elected for silicone, plan smooth round.   Reviewed fat grafting. Reviewed purpose of this to thicken flaps limit visible rippling and aid in contour. Reviewed donor site pain, need for compression, variable take graft, fat necrosis that presents as masses and may require work up. Desires to proceed plan abdominal donor site and recommend purchase Spanx type garment.    Additional risks including but not limited to bleeding hematoma seroma infection need for additional surgeries asymmetry unacceptable cosmetic result damage to adjacent structures blood clots in legs or lungs.   Completed [name] physician patient checklist.   Rx for tramadol and Bactrim given.   Natrelle 133S FV-13-T 500 ml tissue expanders placed bilateral fill volume 400 ml saline bilateral.

## 2021-09-30 ENCOUNTER — Ambulatory Visit: Payer: BC Managed Care – PPO

## 2021-09-30 ENCOUNTER — Other Ambulatory Visit: Payer: Self-pay

## 2021-09-30 DIAGNOSIS — M25612 Stiffness of left shoulder, not elsewhere classified: Secondary | ICD-10-CM

## 2021-09-30 DIAGNOSIS — D0512 Intraductal carcinoma in situ of left breast: Secondary | ICD-10-CM

## 2021-09-30 DIAGNOSIS — M25611 Stiffness of right shoulder, not elsewhere classified: Secondary | ICD-10-CM

## 2021-09-30 DIAGNOSIS — M6281 Muscle weakness (generalized): Secondary | ICD-10-CM

## 2021-09-30 NOTE — Therapy (Signed)
[facility] @ [address] [place], [place], [address] Phone: [phone]   Fax:  [phone]  Physical Therapy Treatment  Patient Details  Name: Sarah Vincent MRN: [id number] Date of Birth: 03/02/1971 Referring Provider (PT): Dr, Barry Dienes   Encounter Date: 09/30/2021   PT End of Session - 09/30/21 1652     Visit Number 8    Number of Visits 14    Date for PT Re-Evaluation 09/30/21    PT Start Time 1608    PT Stop Time 1656    PT Time Calculation (min) 48 min    Activity Tolerance Patient tolerated treatment well    Behavior During Therapy [facility] for tasks assessed/performed             Past Medical History:  Diagnosis Date   Acne    takes Spirolactone   Anxiety    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   Depression    Family history of bladder cancer    Family history of cancer of gallbladder    Restless leg syndrome    takes requip    Past Surgical History:  Procedure Laterality Date   BREAST RECONSTRUCTION WITH PLACEMENT OF TISSUE EXPANDER AND ALLODERM Bilateral 07/22/2021   Procedure: BILATERAL BREAST RECONSTRUCTION WITH PLACEMENT OF TISSUE EXPANDER AND ALLODERM;  Surgeon: Irene Limbo, MD;  Location: [facility];  Service: Plastics;  Laterality: Bilateral;   COLONOSCOPY     TOTAL MASTECTOMY Bilateral 07/22/2021   Procedure: BILATERAL TOTAL MASTECTOMY WITH LEFT SENTINEL LYMPH NODE BIOPSY;  Surgeon: Stark Klein, MD;  Location: [facility];  Service: General;  Laterality: Bilateral;    There were no vitals filed for this visit.   Subjective Assessment - 09/30/21 1609     Subjective They had to order my sleeve so I got 2 but it won't be in for a week. Pre-op went well and surgery is still set for the 30th.  Having fat grafting to fill in the implant areas. My neck is doing much better and I can move better.    Pertinent History Diagnosed with left breast Cancer   with yearly mammogram. Had bilateral mastectomies on 07/22/2021 with immediate reconstruction with tissue expanders. 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  She had 0/5 lymph nodes removed.  She does not require radiation or anti estrogens.                [facility] PT Assessment - 09/30/21 0001       Assessment   Medical Diagnosis Left Breast Cancer    Referring Provider (PT) Dr, Barry Dienes    Onset Date/Surgical Date 07/22/21    Hand Dominance Right      Precautions   Precaution Comments lymphedema risk on left      Prior Function   Level of Independence Independent      Cognition   Overall Cognitive Status Within Functional Limits for tasks assessed      AROM   Right Shoulder Flexion 164 Degrees    Right Shoulder ABduction 180 Degrees    Right Shoulder Internal Rotation --   T 10 IR   Right Shoulder External Rotation 110 Degrees    Left Shoulder Flexion 168 Degrees    Left Shoulder ABduction 180 Degrees    Left Shoulder Internal Rotation --   T5 functional IR   Left Shoulder External Rotation 107 Degrees  LYMPHEDEMA/ONCOLOGY QUESTIONNAIRE - 09/30/21 0001       Surgeries   Number Lymph Nodes Removed 5      Left Upper Extremity Lymphedema   10 cm Proximal to Olecranon Process 28.3 cm    Olecranon Process 24 cm    15 cm Proximal to Ulnar Styloid Process 23 cm    10 cm Proximal to Ulnar Styloid Process 19.5 cm    Just Proximal to Ulnar Styloid Process 14.9 cm    At Base of 2nd Digit 6 cm                Quick Dash - 09/30/21 0001     Open a tight or new jar No difficulty    Do heavy household chores (wash walls, wash floors) Mild difficulty    Carry a shopping bag or briefcase No difficulty    Wash your back Moderate difficulty    Use a knife to cut food No difficulty    Recreational activities in which you take some force or impact through your arm, shoulder, or hand (golf, hammering, tennis) No  difficulty    During the past week, to what extent has your arm, shoulder or hand problem interfered with your normal social activities with family, friends, neighbors, or groups? Slightly    During the past week, to what extent has your arm, shoulder or hand problem limited your work or other regular daily activities Not at all    Arm, shoulder, or hand pain. Mild    Tingling (pins and needles) in your arm, shoulder, or hand None    Difficulty Sleeping No difficulty    DASH Score 11.36 %                    [facility] Adult PT Treatment/Exercise - 09/30/21 0001       Shoulder Exercises: Supine   Other Supine Exercises AAROM flexion/scaption x 5 with dowel                          PT Long Term Goals - 09/30/21 1625       PT LONG TERM GOAL #1   Title Pts Bilateral shoulder ROM will return to Pre-surgery levels    Time 6    Period Weeks    Status Achieved    Target Date 09/30/21      PT LONG TERM GOAL #2   Title Pt will be independent with a HEP for shoulder ROM, strength, core strength    Time 6    Period Weeks    Status Achieved    Target Date 09/30/21      PT LONG TERM GOAL #3   Title Pt will be independent in posture correction (ie decreased scapularprotraction, round shoulders)    Time 6    Period Weeks    Status Partially Met      PT LONG TERM GOAL #4   Title pt will be independent in Long term strengthening program    Time 6    Period Weeks    Status Partially Met      PT LONG TERM GOAL #5   Title Bilateral shoulder abduction will be increased to 165    Time 3    Period Weeks    Status Achieved      PT LONG TERM GOAL #6   Title Quick dash will be no greater than 12% for normal return to activities    Time 6  Period Weeks    Status Achieved    Target Date 09/30/21                   Plan - 09/30/21 1702     Clinical Impression Statement Pt was reassessed today.  Shoulder ROM measured and left arm circumference measured.   Assessed for cording on left and checked all goals.  Pt is pending implant exchange on 10/07/2021 with fat grafting.  She has achieved or partially achieved all goals established.  She feels she knows what to do and can carry it out at home. ROM is back to baseline with some end range tightness, and she is compliant with a HEP.  She was given the ABC strength handout , but was advised not to start it until after her implant exchange when she has healed.  We discussed return to Yoga and starting with light wts 1-3# for upper body exercises.Advised to resume stretching after next surgery when MD allows and to contact us if she feels she requires further therapy. She should get her prophylactic compression sleeve next week. She is discharged from formal PT    Personal Factors and Comorbidities Comorbidity 1    Examination-Activity Limitations Lift;Reach Overhead    Stability/Clinical Decision Making Stable/Uncomplicated    Rehab Potential Excellent    PT Frequency 2x / week    PT Duration 6 weeks    PT Treatment/Interventions ADLs/Self Care Home Management;Therapeutic exercise;Manual techniques;Orthotic Fit/Training;Patient/family education;Manual lymph drainage;Passive range of motion;Scar mobilization    PT Next Visit Plan DC to HEP    PT Home Exercise Plan 4 post op exercises to start after OK by MD, Access Code: [email or web address]    Recommended Other Services awaiting sleeve    Consulted and Agree with Plan of Care Patient             Patient will benefit from skilled therapeutic intervention in order to improve the following deficits and impairments:  Decreased knowledge of precautions, Decreased skin integrity, Decreased strength, Impaired UE functional use, Decreased range of motion, Postural dysfunction, Decreased scar mobility  Visit Diagnosis: Ductal carcinoma in situ of left breast  Muscle weakness (generalized)  Stiffness of left shoulder, not elsewhere classified  Stiffness of right  shoulder, not elsewhere classified     Problem List Patient Active Problem List   Diagnosis Date Noted   Breast cancer, left ([place]) 48/88/9169   Monoallelic mutation of BARD1 gene 06/17/2021   Genetic testing 06/02/2021   Family history of cancer of gallbladder 05/29/2021   Family history of bladder cancer 05/29/2021   Ductal carcinoma in situ (DCIS) of left breast 05/20/2021  PHYSICAL THERAPY DISCHARGE SUMMARY  Visits from Start of Care: 8  Current functional level related to goals / functional outcomes: Achieved or partially achieved all goals   Remaining deficits: Not fully independent with posture correction but working on it.   Education / Equipment: Getting compression sleeve/gauntlet   Patient agrees to discharge. Patient goals were  met or partially met . Patient is being discharged due to being pleased with the current functional level.   Claris Pong, PT 09/30/2021, 5:10 PM  [facility] @ [address] [place], [place], [address] Phone: [phone]   Fax:  [phone]  Name: Sarah Vincent MRN: [id number] Date of Birth: 1971-06-13

## 2021-10-07 ENCOUNTER — Encounter: Payer: BC Managed Care – PPO | Admitting: Rehabilitation

## 2021-10-08 ENCOUNTER — Encounter (HOSPITAL_BASED_OUTPATIENT_CLINIC_OR_DEPARTMENT_OTHER): Payer: Self-pay | Admitting: Plastic Surgery

## 2021-10-08 ENCOUNTER — Other Ambulatory Visit: Payer: Self-pay

## 2021-10-09 NOTE — Progress Notes (Signed)

## 2021-10-16 NOTE — Anesthesia Preprocedure Evaluation (Addendum)
Anesthesia Evaluation  Patient identified by MRN, date of birth, ID band Patient awake    Reviewed: Allergy & Precautions, NPO status , Patient's Chart, lab work & pertinent test results  Airway Mallampati: II  TM Distance: >3 FB Neck ROM: Full    Dental no notable dental hx. (+) Teeth Intact, Dental Advisory Given   Pulmonary neg pulmonary ROS,    Pulmonary exam normal breath sounds clear to auscultation       Cardiovascular negative cardio ROS Normal cardiovascular exam Rhythm:Regular Rate:Normal     Neuro/Psych Anxiety Depression negative neurological ROS  negative psych ROS   GI/Hepatic negative GI ROS, Neg liver ROS,   Endo/Other  negative endocrine ROS  Renal/GU negative Renal ROS  negative genitourinary   Musculoskeletal negative musculoskeletal ROS (+)   Abdominal   Peds negative pediatric ROS (+)  Hematology negative hematology ROS (+)   Anesthesia Other Findings Breast Cancer  Reproductive/Obstetrics negative OB ROS                            Anesthesia Physical  Anesthesia Plan  ASA: 2  Anesthesia Plan: General   Post-op Pain Management: Ofirmev IV (intra-op)   Induction: Intravenous  PONV Risk Score and Plan: 3 and Ondansetron, Dexamethasone, Midazolam and Treatment may vary due to age or medical condition  Airway Management Planned: Oral ETT and LMA  Additional Equipment: None  Intra-op Plan:   Post-operative Plan: Extubation in OR  Informed Consent: I have reviewed the patients History and Physical, chart, labs and discussed the procedure including the risks, benefits and alternatives for the proposed anesthesia with the patient or authorized representative who has indicated his/her understanding and acceptance.     Dental advisory given  Plan Discussed with: CRNA and Anesthesiologist  Anesthesia Plan Comments:         Anesthesia Quick  Evaluation

## 2021-10-17 ENCOUNTER — Ambulatory Visit (HOSPITAL_BASED_OUTPATIENT_CLINIC_OR_DEPARTMENT_OTHER): Payer: BC Managed Care – PPO | Admitting: Anesthesiology

## 2021-10-17 ENCOUNTER — Other Ambulatory Visit: Payer: Self-pay

## 2021-10-17 ENCOUNTER — Ambulatory Visit (HOSPITAL_BASED_OUTPATIENT_CLINIC_OR_DEPARTMENT_OTHER)
Admission: RE | Admit: 2021-10-17 | Discharge: 2021-10-17 | Disposition: A | Discharge status: Home / Self Care | Payer: BC Managed Care – PPO | Attending: Plastic Surgery | Admitting: Plastic Surgery

## 2021-10-17 ENCOUNTER — Encounter (HOSPITAL_BASED_OUTPATIENT_CLINIC_OR_DEPARTMENT_OTHER)
Admission: RE | Disposition: A | Discharge status: Home / Self Care | Payer: Self-pay | Source: Home / Self Care | Attending: Plastic Surgery

## 2021-10-17 ENCOUNTER — Encounter (HOSPITAL_BASED_OUTPATIENT_CLINIC_OR_DEPARTMENT_OTHER): Payer: Self-pay | Admitting: Plastic Surgery

## 2021-10-17 DIAGNOSIS — Z9013 Acquired absence of bilateral breasts and nipples: Secondary | ICD-10-CM | POA: Diagnosis not present

## 2021-10-17 DIAGNOSIS — Z853 Personal history of malignant neoplasm of breast: Secondary | ICD-10-CM | POA: Diagnosis not present

## 2021-10-17 DIAGNOSIS — Z421 Encounter for breast reconstruction following mastectomy: Secondary | ICD-10-CM | POA: Diagnosis not present

## 2021-10-17 DIAGNOSIS — Z45812 Encounter for adjustment or removal of left breast implant: Secondary | ICD-10-CM | POA: Diagnosis present

## 2021-10-17 HISTORY — PX: LIPOSUCTION WITH LIPOFILLING: SHX6436

## 2021-10-17 HISTORY — PX: REMOVAL OF BILATERAL TISSUE EXPANDERS WITH PLACEMENT OF BILATERAL BREAST IMPLANTS: SHX6431

## 2021-10-17 LAB — POCT PREGNANCY, URINE: Preg Test, Ur: NEGATIVE

## 2021-10-17 SURGERY — REMOVAL, TISSUE EXPANDER, BREAST, BILATERAL, WITH BILATERAL IMPLANT IMPLANT INSERTION
Anesthesia: General | Site: Chest | Laterality: Bilateral

## 2021-10-17 MED ORDER — PROPOFOL 10 MG/ML IV BOLUS
INTRAVENOUS | Status: DC | PRN
  Administered 2021-10-17: 150 mg via INTRAVENOUS
  Administered 2021-10-17: 30 mg via INTRAVENOUS
  Administered 2021-10-17: 20 mg via INTRAVENOUS

## 2021-10-17 MED ORDER — BUPIVACAINE HCL (PF) 0.5 % IJ SOLN
INTRAMUSCULAR | Status: AC
  Filled 2021-10-17: qty 30

## 2021-10-17 MED ORDER — OXYCODONE HCL 5 MG PO TABS
5.0000 mg | ORAL_TABLET | Freq: Once | ORAL | Status: AC | PRN
  Administered 2021-10-17: 11:00:00 5 mg via ORAL

## 2021-10-17 MED ORDER — CHLORHEXIDINE GLUCONATE CLOTH 2 % EX PADS: 6.0000 | MEDICATED_PAD | Freq: Once | CUTANEOUS | Status: DC

## 2021-10-17 MED ORDER — SUGAMMADEX SODIUM 200 MG/2ML IV SOLN
INTRAVENOUS | Status: DC | PRN
  Administered 2021-10-17: 150 mg via INTRAVENOUS

## 2021-10-17 MED ORDER — MEPERIDINE HCL 25 MG/ML IJ SOLN
6.2500 mg | INTRAMUSCULAR | Status: DC | PRN
Start: 2021-10-17 — End: 2021-10-17
  Administered 2021-10-17: 10:00:00 6.25 mg via INTRAVENOUS
  Administered 2021-10-17: 10:00:00 12.5 mg via INTRAVENOUS
  Administered 2021-10-17: 10:00:00 6.25 mg via INTRAVENOUS

## 2021-10-17 MED ORDER — ONDANSETRON HCL 4 MG/2ML IJ SOLN
INTRAMUSCULAR | Status: DC | PRN
  Administered 2021-10-17: 4 mg via INTRAVENOUS

## 2021-10-17 MED ORDER — CELECOXIB 200 MG PO CAPS
ORAL_CAPSULE | ORAL | Status: AC
  Filled 2021-10-17: qty 1

## 2021-10-17 MED ORDER — PROPOFOL 10 MG/ML IV BOLUS
INTRAVENOUS | Status: AC
  Filled 2021-10-17: qty 20

## 2021-10-17 MED ORDER — SODIUM BICARBONATE 4.2 % IV SOLN
INTRAVENOUS | Status: AC
  Filled 2021-10-17: qty 10

## 2021-10-17 MED ORDER — LIDOCAINE HCL (CARDIAC) PF 100 MG/5ML IV SOSY
PREFILLED_SYRINGE | INTRAVENOUS | Status: DC | PRN
  Administered 2021-10-17: 60 mg via INTRAVENOUS

## 2021-10-17 MED ORDER — DEXAMETHASONE SODIUM PHOSPHATE 4 MG/ML IJ SOLN
INTRAMUSCULAR | Status: DC | PRN
  Administered 2021-10-17: 10 mg via INTRAVENOUS

## 2021-10-17 MED ORDER — DIPHENHYDRAMINE HCL 50 MG/ML IJ SOLN
INTRAMUSCULAR | Status: DC | PRN
  Administered 2021-10-17: 25 mg via INTRAVENOUS

## 2021-10-17 MED ORDER — CIPROFLOXACIN IN D5W 400 MG/200ML IV SOLN
INTRAVENOUS | Status: AC
  Filled 2021-10-17: qty 200

## 2021-10-17 MED ORDER — PROPOFOL 500 MG/50ML IV EMUL
INTRAVENOUS | Status: AC
  Filled 2021-10-17: qty 50

## 2021-10-17 MED ORDER — EPHEDRINE 5 MG/ML INJ
INTRAVENOUS | Status: AC
  Filled 2021-10-17: qty 5

## 2021-10-17 MED ORDER — DIPHENHYDRAMINE HCL 50 MG/ML IJ SOLN
INTRAMUSCULAR | Status: AC
  Filled 2021-10-17: qty 1

## 2021-10-17 MED ORDER — ROCURONIUM BROMIDE 100 MG/10ML IV SOLN
INTRAVENOUS | Status: DC | PRN
  Administered 2021-10-17: 60 mg via INTRAVENOUS

## 2021-10-17 MED ORDER — CELECOXIB 200 MG PO CAPS
200.0000 mg | ORAL_CAPSULE | ORAL | Status: AC
  Administered 2021-10-17: 07:00:00 200 mg via ORAL

## 2021-10-17 MED ORDER — PROPOFOL 500 MG/50ML IV EMUL
INTRAVENOUS | Status: DC | PRN
  Administered 2021-10-17: 25 ug/kg/min via INTRAVENOUS

## 2021-10-17 MED ORDER — MIDAZOLAM HCL 2 MG/2ML IJ SOLN
INTRAMUSCULAR | Status: AC
  Filled 2021-10-17: qty 2

## 2021-10-17 MED ORDER — SODIUM CHLORIDE 0.9 % IV SOLN
INTRAVENOUS | Status: DC | PRN
  Administered 2021-10-17: 08:00:00 500 mL

## 2021-10-17 MED ORDER — LIDOCAINE HCL (PF) 1 % IJ SOLN
INTRAMUSCULAR | Status: AC
  Filled 2021-10-17: qty 30

## 2021-10-17 MED ORDER — MIDAZOLAM HCL 5 MG/5ML IJ SOLN
INTRAMUSCULAR | Status: DC | PRN
  Administered 2021-10-17: 2 mg via INTRAVENOUS

## 2021-10-17 MED ORDER — FENTANYL CITRATE (PF) 100 MCG/2ML IJ SOLN: 25.0000 ug | INTRAMUSCULAR | Status: DC | PRN

## 2021-10-17 MED ORDER — EPINEPHRINE PF 1 MG/ML IJ SOLN
INTRAMUSCULAR | Status: AC
  Filled 2021-10-17: qty 1

## 2021-10-17 MED ORDER — ACETAMINOPHEN 160 MG/5ML PO SOLN: 325.0000 mg | ORAL | Status: DC | PRN

## 2021-10-17 MED ORDER — FENTANYL CITRATE (PF) 100 MCG/2ML IJ SOLN
INTRAMUSCULAR | Status: AC
  Filled 2021-10-17: qty 2

## 2021-10-17 MED ORDER — SODIUM BICARBONATE 4.2 % IV SOLN
INTRAVENOUS | Status: DC | PRN
  Administered 2021-10-17: 400 mL via INTRAMUSCULAR

## 2021-10-17 MED ORDER — CIPROFLOXACIN IN D5W 400 MG/200ML IV SOLN
400.0000 mg | INTRAVENOUS | Status: AC
  Administered 2021-10-17: 08:00:00 400 mg via INTRAVENOUS

## 2021-10-17 MED ORDER — OXYCODONE HCL 5 MG PO TABS
ORAL_TABLET | ORAL | Status: AC
  Filled 2021-10-17: qty 1

## 2021-10-17 MED ORDER — ONDANSETRON HCL 4 MG/2ML IJ SOLN
INTRAMUSCULAR | Status: AC
  Filled 2021-10-17: qty 2

## 2021-10-17 MED ORDER — FENTANYL CITRATE (PF) 100 MCG/2ML IJ SOLN
INTRAMUSCULAR | Status: DC | PRN
  Administered 2021-10-17: 100 ug via INTRAVENOUS
  Administered 2021-10-17: 25 ug via INTRAVENOUS

## 2021-10-17 MED ORDER — ROCURONIUM BROMIDE 10 MG/ML (PF) SYRINGE
PREFILLED_SYRINGE | INTRAVENOUS | Status: AC
  Filled 2021-10-17: qty 10

## 2021-10-17 MED ORDER — ONDANSETRON HCL 4 MG/2ML IJ SOLN: 4.0000 mg | Freq: Once | INTRAMUSCULAR | Status: DC | PRN

## 2021-10-17 MED ORDER — ACETAMINOPHEN 10 MG/ML IV SOLN
INTRAVENOUS | Status: AC
  Filled 2021-10-17: qty 100

## 2021-10-17 MED ORDER — GABAPENTIN 300 MG PO CAPS
300.0000 mg | ORAL_CAPSULE | ORAL | Status: AC
  Administered 2021-10-17: 07:00:00 300 mg via ORAL

## 2021-10-17 MED ORDER — LACTATED RINGERS IV SOLN: INTRAVENOUS | Status: DC

## 2021-10-17 MED ORDER — ACETAMINOPHEN 325 MG PO TABS: 325.0000 mg | ORAL_TABLET | ORAL | Status: DC | PRN

## 2021-10-17 MED ORDER — MEPERIDINE HCL 25 MG/ML IJ SOLN
INTRAMUSCULAR | Status: AC
  Filled 2021-10-17: qty 1

## 2021-10-17 MED ORDER — ACETAMINOPHEN 500 MG PO TABS
1000.0000 mg | ORAL_TABLET | ORAL | Status: AC
  Administered 2021-10-17: 07:00:00 1000 mg via ORAL

## 2021-10-17 MED ORDER — EPHEDRINE SULFATE 50 MG/ML IJ SOLN
INTRAMUSCULAR | Status: DC | PRN
  Administered 2021-10-17: 5 mg via INTRAVENOUS

## 2021-10-17 MED ORDER — GABAPENTIN 300 MG PO CAPS
ORAL_CAPSULE | ORAL | Status: AC
  Filled 2021-10-17: qty 1

## 2021-10-17 MED ORDER — OXYCODONE HCL 5 MG/5ML PO SOLN: 5.0000 mg | Freq: Once | ORAL | Status: AC | PRN

## 2021-10-17 MED ORDER — DEXAMETHASONE SODIUM PHOSPHATE 10 MG/ML IJ SOLN
INTRAMUSCULAR | Status: AC
  Filled 2021-10-17: qty 1

## 2021-10-17 MED ORDER — 0.9 % SODIUM CHLORIDE (POUR BTL) OPTIME
TOPICAL | Status: DC | PRN
  Administered 2021-10-17: 08:00:00 1000 mL

## 2021-10-17 MED ORDER — SODIUM CHLORIDE 0.9 % IV SOLN
INTRAVENOUS | Status: AC
  Filled 2021-10-17: qty 10

## 2021-10-17 MED ORDER — LIDOCAINE 2% (20 MG/ML) 5 ML SYRINGE
INTRAMUSCULAR | Status: AC
  Filled 2021-10-17: qty 5

## 2021-10-17 MED ORDER — ACETAMINOPHEN 500 MG PO TABS
ORAL_TABLET | ORAL | Status: AC
  Filled 2021-10-17: qty 2

## 2021-10-17 SURGICAL SUPPLY — 84 items
ADH SKN CLS APL DERMABOND .7 (GAUZE/BANDAGES/DRESSINGS) ×4
APL PRP STRL LF DISP 70% ISPRP (MISCELLANEOUS)
BAG DECANTER FOR FLEXI CONT (MISCELLANEOUS) ×3 IMPLANT
BINDER ABDOMINAL 10 UNV 27-48 (MISCELLANEOUS) ×1 IMPLANT
BINDER ABDOMINAL 12 SM 30-45 (SOFTGOODS) IMPLANT
BINDER BREAST 3XL (GAUZE/BANDAGES/DRESSINGS) IMPLANT
BINDER BREAST LRG (GAUZE/BANDAGES/DRESSINGS) IMPLANT
BINDER BREAST MEDIUM (GAUZE/BANDAGES/DRESSINGS) IMPLANT
BINDER BREAST XLRG (GAUZE/BANDAGES/DRESSINGS) ×1 IMPLANT
BINDER BREAST XXLRG (GAUZE/BANDAGES/DRESSINGS) IMPLANT
BLADE SURG 10 STRL SS (BLADE) ×5 IMPLANT
BLADE SURG 11 STRL SS (BLADE) ×4 IMPLANT
BNDG GAUZE ELAST 4 BULKY (GAUZE/BANDAGES/DRESSINGS) ×6 IMPLANT
CANISTER LIPO FAT HARVEST (MISCELLANEOUS) ×3 IMPLANT
CANISTER SUCT 1200ML W/VALVE (MISCELLANEOUS) ×3 IMPLANT
CHLORAPREP W/TINT 26 (MISCELLANEOUS) ×2 IMPLANT
COVER BACK TABLE 60X90IN (DRAPES) ×3 IMPLANT
COVER MAYO STAND STRL (DRAPES) ×6 IMPLANT
DECANTER SPIKE VIAL GLASS SM (MISCELLANEOUS) IMPLANT
DERMABOND ADVANCED (GAUZE/BANDAGES/DRESSINGS) ×2
DERMABOND ADVANCED .7 DNX12 (GAUZE/BANDAGES/DRESSINGS) ×4 IMPLANT
DRAIN CHANNEL 15F RND FF W/TCR (WOUND CARE) IMPLANT
DRAPE TOP ARMCOVERS (MISCELLANEOUS) ×3 IMPLANT
DRAPE U-SHAPE 76X120 STRL (DRAPES) ×3 IMPLANT
DRAPE UTILITY XL STRL (DRAPES) ×3 IMPLANT
DRSG PAD ABDOMINAL 8X10 ST (GAUZE/BANDAGES/DRESSINGS) ×6 IMPLANT
DURAPREP 26ML APPLICATOR (WOUND CARE) ×2 IMPLANT
ELECT BLADE 4.0 EZ CLEAN MEGAD (MISCELLANEOUS)
ELECT COATED BLADE 2.86 ST (ELECTRODE) ×3 IMPLANT
ELECT REM PT RETURN 9FT ADLT (ELECTROSURGICAL) ×3
ELECTRODE BLDE 4.0 EZ CLN MEGD (MISCELLANEOUS) ×2 IMPLANT
ELECTRODE REM PT RTRN 9FT ADLT (ELECTROSURGICAL) ×2 IMPLANT
EVACUATOR SILICONE 100CC (DRAIN) IMPLANT
GLOVE SURG HYDRASOFT LTX SZ5.5 (GLOVE) ×6 IMPLANT
GOWN STRL REUS W/ TWL LRG LVL3 (GOWN DISPOSABLE) ×4 IMPLANT
GOWN STRL REUS W/TWL LRG LVL3 (GOWN DISPOSABLE) ×6
IMPL BREAST 6.3X FULL 560 (Breast) IMPLANT
IMPL BRST 6.3X FULL 560CC (Breast) ×4 IMPLANT
IMPLANT BREAST GEL 560CC (Breast) ×6 IMPLANT
KIT FILL SYSTEM UNIVERSAL (SET/KITS/TRAYS/PACK) IMPLANT
LINER CANISTER 1000CC FLEX (MISCELLANEOUS) ×3 IMPLANT
MARKER SKIN DUAL TIP RULER LAB (MISCELLANEOUS) IMPLANT
NDL FILTER BLUNT 18X1 1/2 (NEEDLE) IMPLANT
NDL HYPO 25X1 1.5 SAFETY (NEEDLE) IMPLANT
NDL SAFETY ECLIPSE 18X1.5 (NEEDLE) ×2 IMPLANT
NEEDLE FILTER BLUNT 18X 1/2SAF (NEEDLE) ×1
NEEDLE FILTER BLUNT 18X1 1/2 (NEEDLE) ×2 IMPLANT
NEEDLE HYPO 18GX1.5 SHARP (NEEDLE)
NEEDLE HYPO 25X1 1.5 SAFETY (NEEDLE) IMPLANT
NS IRRIG 1000ML POUR BTL (IV SOLUTION) ×1 IMPLANT
PACK BASIN DAY SURGERY FS (CUSTOM PROCEDURE TRAY) ×3 IMPLANT
PAD ALCOHOL SWAB (MISCELLANEOUS) ×3 IMPLANT
PENCIL SMOKE EVACUATOR (MISCELLANEOUS) ×3 IMPLANT
PIN SAFETY STERILE (MISCELLANEOUS) IMPLANT
SHEET MEDIUM DRAPE 40X70 STRL (DRAPES) ×6 IMPLANT
SIZER BREAST 560CC (SIZER) ×3
SIZER BREAST 580CC SNGL USE (SIZER) ×3
SIZER BRST 580CC SNGL USE (SIZER) IMPLANT
SIZER BRSTX STRL LF SIL 560CC (SIZER) IMPLANT
SLEEVE SCD COMPRESS KNEE MED (STOCKING) ×3 IMPLANT
SPONGE T-LAP 18X18 ~~LOC~~+RFID (SPONGE) ×6 IMPLANT
STAPLER VISISTAT 35W (STAPLE) ×3 IMPLANT
SUT ETHILON 2 0 FS 18 (SUTURE) IMPLANT
SUT MNCRL AB 4-0 PS2 18 (SUTURE) ×6 IMPLANT
SUT PDS AB 2-0 CT2 27 (SUTURE) IMPLANT
SUT VIC AB 3-0 PS1 18 (SUTURE)
SUT VIC AB 3-0 PS1 18XBRD (SUTURE) IMPLANT
SUT VIC AB 3-0 SH 27 (SUTURE) ×6
SUT VIC AB 3-0 SH 27X BRD (SUTURE) ×4 IMPLANT
SUT VICRYL 4-0 PS2 18IN ABS (SUTURE) ×6 IMPLANT
SYR 10ML LL (SYRINGE) ×9 IMPLANT
SYR 20ML LL LF (SYRINGE) IMPLANT
SYR 50ML LL SCALE MARK (SYRINGE) ×4 IMPLANT
SYR BULB IRRIG 60ML STRL (SYRINGE) ×6 IMPLANT
SYR CONTROL 10ML LL (SYRINGE) IMPLANT
SYR TB 1ML LL NO SAFETY (SYRINGE) ×2 IMPLANT
SYR TOOMEY IRRIG 70ML (MISCELLANEOUS)
SYRINGE TOOMEY IRRIG 70ML (MISCELLANEOUS) IMPLANT
TOWEL GREEN STERILE FF (TOWEL DISPOSABLE) ×6 IMPLANT
TUBE CONNECTING 20X1/4 (TUBING) ×4 IMPLANT
TUBING INFILTRATION IT-10001 (TUBING) ×3 IMPLANT
TUBING SET GRADUATE ASPIR 12FT (MISCELLANEOUS) ×3 IMPLANT
UNDERPAD 30X36 HEAVY ABSORB (UNDERPADS AND DIAPERS) ×6 IMPLANT
YANKAUER SUCT BULB TIP NO VENT (SUCTIONS) ×3 IMPLANT

## 2021-10-17 NOTE — Transfer of Care (Signed)
Immediate Anesthesia Transfer of Care Note  Patient: Sarah Vincent  Procedure(s) Performed: REMOVAL OF BILATERAL TISSUE EXPANDERS WITH PLACEMENT OF BILATERAL BREAST SILICONE IMPLANTS (Bilateral: Chest) LIPOFILLING FROM ABDOMEN TO BILATERAL CHEST (Bilateral: Abdomen)  Patient Location: PACU  Anesthesia Type:General  Level of Consciousness: drowsy, patient cooperative and responds to stimulation  Airway & Oxygen Therapy: Patient Spontanous Breathing and Patient connected to face mask oxygen  Post-op Assessment: Report given to RN and Post -op Vital signs reviewed and stable  Post vital signs: Reviewed and stable  Last Vitals:  Vitals Value Taken Time  BP    Temp    Pulse 70 10/17/21 1002  Resp    SpO2 99 % 10/17/21 1002  Vitals shown include unvalidated device data.  Last Pain:  Vitals:   10/17/21 0645  TempSrc: Oral  PainSc: 0-No pain         Complications: No notable events documented.

## 2021-10-17 NOTE — Discharge Instructions (Addendum)
Tylenol 1000 mg given at 6:49 a.m. Post Anesthesia Home Care Instructions  Activity: Get plenty of rest for the remainder of the day. A responsible individual must stay with you for 24 hours following the procedure.  For the next 24 hours, DO NOT: -Drive a car -Paediatric nurse -Drink alcoholic beverages -Take any medication unless instructed by your physician -Make any legal decisions or sign important papers.  Meals: Start with liquid foods such as gelatin or soup. Progress to regular foods as tolerated. Avoid greasy, spicy, heavy foods. If nausea and/or vomiting occur, drink only clear liquids until the nausea and/or vomiting subsides. Call your physician if vomiting continues.  Special Instructions/Symptoms: Your throat may feel dry or sore from the anesthesia or the breathing tube placed in your throat during surgery. If this causes discomfort, gargle with warm salt water. The discomfort should disappear within 24 hours.  If you had a scopolamine patch placed behind your ear for the management of post- operative nausea and/or vomiting:  1. The medication in the patch is effective for 72 hours, after which it should be removed.  Wrap patch in a tissue and discard in the trash. Wash hands thoroughly with soap and water. 2. You may remove the patch earlier than 72 hours if you experience unpleasant side effects which may include dry mouth, dizziness or visual disturbances. 3. Avoid touching the patch. Wash your hands with soap and water after contact with the patch.     No Tylenol or Ibuprofen until after 1:00pm today.

## 2021-10-17 NOTE — Op Note (Signed)
Operative Note   DATE OF OPERATION: 12.30.22  LOCATION: [facility]-outpatient  SURGICAL DIVISION: Plastic Surgery  PREOPERATIVE DIAGNOSES:  1. History DCIS 2. BARD1 mutation 3. Acquired absence bilateral breast  POSTOPERATIVE DIAGNOSES:  same  PROCEDURE:  1. Removal bilateral chest tissue expanders and placement silicone implants 2. Lipofilling from abdomen to bilateral chest total 120 ml.  SURGEON: Irene Limbo MD MBA  ASSISTANT: none  ANESTHESIA:  General.   EBL: 20 ml  COMPLICATIONS: None immediate.   INDICATIONS FOR PROCEDURE:  The patient, Sarah Vincent, is a 50 y.o. female born on Jul 21, 1971, is here for staged breast reconstruction following bilateral skin reduction pattern mastectomies with immediate prepectoral tissue expander reconstruction.   FINDINGS: Complete incorporation ADM noted bilateral. Natrelle Soft Touch smooth round Extra Projection 560 ml implants placed bilateral. REF [id number] RIGHT SN [id number] LEFT SN [id number]. 60 ml fat infiltrated over right chest, 60 ml fat infiltrated over left chest.  DESCRIPTION OF PROCEDURE:  The patient's operative site was marked including supra and infra umbilical abdomen marked for liposuction. The patient was taken to the operating room. SCDs were placed and IV antibiotics were given. The patient's operative site was prepped and draped in a sterile fashion. A time out was performed and all information was confirmed to be correct. I began on left side. Incision made through prior inframammary fold scar and carried through superficial fascia and acellular dermis. Expander removed. Superior capsulotomy performed. Sizer placed.   I then directed attention to right chest. Incision made in prior inframammary fold scar and carried through superficial fascia and acellular dermis. Expander removed and well incorporated ADM noted. Superior capsulotomy performed. Sizer placed. Patient brought to upright sitting position. Natrelle  Smooth Round Extra Projection 560 ml implant selected for each chest. Patient returned to supine position.   Stab incision made over bilateral abdomen. Tumescent fluid infiltrated over supra and infra umbilical abdomen, total 400 ml tumescent infiltrated. Power assisted liposuction performed to endpoint symmetric contour and soft tissue thickness, total lipoaspirate 300 ml. The fat was then washed and prepared by gravity for infiltration. Harvested fat was then infiltrated in subcutaneous plane throughout total envelope bilateral mastectomy flaps.    Each cavity irrigated with saline solution containing Betadine, Ancef, gentamicin solution. Hemostasis ensured.The implant was placed in right chest and implant orientation ensured. Closure completed with 3-0 vicryl to close superficial fascia and capsule over implant. 4-0 vicryl used to close dermis followed by 4-0 monocryl subcuticular. Implant placed in left chest cavity. Closure completed with 3-0 vicryl to close superficial fascia and capsule over implant. 4-0 vicryl used to close dermis followed by 4-0 monocryl subcuticular skin closure. Abdomen incisions approximated with simple 4-0 monocryl stitch. Dermabond applied to chest incisions. Dry dressing applied, followed by breast binder and abdominal binder.  The patient was allowed to wake from anesthesia, extubated and taken to the recovery room in satisfactory condition.   SPECIMENS: none  DRAINS: none

## 2021-10-17 NOTE — Anesthesia Procedure Notes (Signed)
Procedure Name: Intubation Date/Time: 10/17/2021 7:38 AM Performed by: Glory Buff, CRNA Pre-anesthesia Checklist: Patient identified, Emergency Drugs available, Suction available and Patient being monitored Patient Re-evaluated:Patient Re-evaluated prior to induction Oxygen Delivery Method: Circle system utilized Preoxygenation: Pre-oxygenation with 100% oxygen Induction Type: IV induction Ventilation: Mask ventilation without difficulty Laryngoscope Size: Miller and 3 Grade View: Grade I Tube type: Oral Tube size: 7.0 mm Number of attempts: 1 Airway Equipment and Method: Stylet and Oral airway Placement Confirmation: ETT inserted through vocal cords under direct vision, positive ETCO2 and breath sounds checked- equal and bilateral Secured at: 21 cm Tube secured with: Tape Dental Injury: Teeth and Oropharynx as per pre-operative assessment

## 2021-10-17 NOTE — Interval H&P Note (Signed)
History and Physical Interval Note:  10/17/2021 6:52 AM  Sarah Vincent  has presented today for surgery, with the diagnosis of history breast ca, acquired absence breasts.  The various methods of treatment have been discussed with the patient and family. After consideration of risks, benefits and other options for treatment, the patient has consented to  Procedure(s): REMOVAL OF BILATERAL TISSUE EXPANDERS WITH PLACEMENT OF BILATERAL BREAST SILICONE IMPLANTS (Bilateral) LIPOFILLING FROM ABDOMEN TO BILATERAL CHEST (Bilateral) as a surgical intervention.  The patient's history has been reviewed, patient examined, no change in status, stable for surgery.  I have reviewed the patient's chart and labs.  Questions were answered to the patient's satisfaction.     Arnoldo Hooker Lealon Vanputten

## 2021-10-17 NOTE — Anesthesia Postprocedure Evaluation (Signed)
Anesthesia Post Note  Patient: Sarah Vincent  Procedure(s) Performed: REMOVAL OF BILATERAL TISSUE EXPANDERS WITH PLACEMENT OF BILATERAL BREAST SILICONE IMPLANTS (Bilateral: Chest) LIPOFILLING FROM ABDOMEN TO BILATERAL CHEST (Bilateral: Abdomen)     Patient location during evaluation: PACU Anesthesia Type: General Level of consciousness: awake and alert Pain management: pain level controlled Vital Signs Assessment: post-procedure vital signs reviewed and stable Respiratory status: spontaneous breathing, nonlabored ventilation, respiratory function stable and patient connected to nasal cannula oxygen Cardiovascular status: blood pressure returned to baseline and stable Postop Assessment: no apparent nausea or vomiting Anesthetic complications: no   No notable events documented.  Last Vitals:  Vitals:   10/17/21 1030 10/17/21 1045  BP: 129/80 131/84  Pulse: 64 69  Resp: 11 18  Temp:  36.6 C  SpO2: 100% 94%    Last Pain:  Vitals:   10/17/21 1045  TempSrc:   PainSc: 0-No pain                 Tyus Kallam

## 2021-10-21 ENCOUNTER — Encounter (HOSPITAL_BASED_OUTPATIENT_CLINIC_OR_DEPARTMENT_OTHER): Payer: Self-pay | Admitting: Plastic Surgery

## 2021-10-27 ENCOUNTER — Ambulatory Visit: Payer: BC Managed Care – PPO

## 2021-11-02 NOTE — Assessment & Plan Note (Addendum)
05/20/2021: Screening mammogram detected left breast calcifications UOQ: 1.8 cm, stereotactic biopsy revealed high-grade DCIS with necrosis and calcifications with focal suspicious microinvasion, ER 95%, PR 5%, axilla negative, breast MRI revealed 4.6 cm area of non-mass enhancement, multiple axillary lymph nodes at least 7.  07/22/2021: Bilateral mastectomies:  Right mastectomy: Benign Left mastectomy: Focus of microinvasive ductal carcinoma 0.1 cm, grade 1, high-grade DCIS with necrosis, margins negative, LCIS, 0/5 lymph nodes negative, ER 95%, PR 95%, HER2 and Ki-67 not performed   The patient has completed what is considered curative therapy for her ductal carcinoma in situ with bilateral mastectomies.  She has not continued on any antiestrogen therapy.  We discussed survivorship and living her best life after cancer.  This includes healthy diet eating plenty of different colors of fruits and vegetables.  This also includes exercise 150 to 300 minutes a week.    She wonders if there is anything going on with her well water.  She has not had her water tested.  I let her know that that would be an option if she is concerned about it however another option would be getting a filter for her water.  We discussed staying up-to-date with other cancer screenings such as colonoscopy, Pap smears, skin exam.  We reviewed that there is no role of routine imaging with mammogram or MRI to evaluate for recurrent breast cancer.  However based on the type of her implants she will follow-up with Dr. Iran Planas to discuss imaging for silent rupture surveillance.  She has follow-up with Dr. Iran Planas later this month.  I let the patient know that we are happy to see her at any point should she need [place] in the future.

## 2021-11-02 NOTE — Progress Notes (Signed)
[facility] Follow up:    [place], Bill [place]inas, MD [place] [address]   DIAGNOSIS:  Cancer Staging  Ductal carcinoma in situ (DCIS) of left breast Staging form: Breast, AJCC 8th Edition - Clinical stage from 05/28/2021: Stage 0 (cTis (DCIS), cN0, cM0, ER+, PR+) - Signed by Nicholas Lose, MD on 05/28/2021 Stage prefix: Initial diagnosis Nuclear grade: G3    SUMMARY OF ONCOLOGIC HISTORY: Oncology History  Ductal carcinoma in situ (DCIS) of left breast  05/20/2021 Initial Diagnosis   Screening mammogram: calcifications in the left breast and no malignancy in the right breast. Diagnostic mammogram and [place]: indeterminate left breast calcifications spanning up to 1.8 cm. Biopsy: high-grade DCIS with necrosis and calcifications, focally suspicious for microinvasion ER/PR+ (95%).    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 Testing was completed through the BRCAplus panel (report date 06/02/2021) and CancerNext-Expanded+RNAinsight panel (report date 06/10/2021) offered by [employer].   The BRCAplus panel offered by [employer] and includes sequencing and deletion/duplication analysis for the following 8 genes: ATM, BRCA1, BRCA2, CDH1, CHEK2, PALB2, PTEN, and TP53. The CancerNext-Expanded + RNAinsight gene panel offered by [employer] and includes sequencing and rearrangement analysis for the following 77 genes: AIP, ALK, APC, ATM, AXIN2, BAP1, BARD1, BLM, BMPR1A, BRCA1, BRCA2, BRIP1, CDC73, CDH1, CDK4, CDKN1B, CDKN2A, CHEK2, CTNNA1, DICER1, FANCC, FH, FLCN, GALNT12, KIF1B, LZTR1, MAX, MEN1,  MET, MLH1, MSH2, MSH3, MSH6, MUTYH, NBN, NF1, NF2, NTHL1, PALB2, PHOX2B, PMS2, POT1, PRKAR1A, PTCH1, PTEN, RAD51C, RAD51D, RB1, RECQL, RET, SDHA, SDHAF2, SDHB, SDHC, SDHD, SMAD4, SMARCA4, SMARCB1, SMARCE1, STK11, SUFU, TMEM127, TP53, TSC1, TSC2, VHL and XRCC2 (sequencing and deletion/duplication); EGFR, EGLN1, HOXB13, KIT, MITF, PDGFRA, POLD1 and POLE (sequencing only); EPCAM and GREM1 (deletion/duplication only). RNA data is routinely analyzed for use in variant interpretation for all genes.   07/22/2021 Surgery   Right mastectomy: Benign Left mastectomy: Focus of microinvasive ductal carcinoma 0.1 cm, grade 1, high-grade DCIS with necrosis, margins negative, LCIS, 0/5 lymph nodes negative, ER 95%, PR 95%, HER2 and Ki-67 not performed     CURRENT THERAPY: Rosanne Sack 51 y.o. female returns for evaluation of her history of breast cancer.  She is doing quite well today.  She is status post bilateral mastectomies and is healing well.  She wants more information on survivorship and life after cancer and how to keep her risk as low as possible.   Patient Active Problem List   Diagnosis Date Noted   Breast cancer, left ([facility]) 57/26/2035   Monoallelic mutation of BARD1 gene 06/17/2021   Genetic testing 06/02/2021   Family history of cancer of gallbladder 05/29/2021   Family history of bladder cancer 05/29/2021   Ductal carcinoma in situ (DCIS) of left breast 05/20/2021    is allergic to chlorhexidine and penicillins.  MEDICAL HISTORY: Past Medical History:  Diagnosis Date   Acne    takes Spirolactone   Anxiety    Breast cancer ([facility])    Left breast DCIS   Depression    Family history of bladder cancer    Family history of cancer of gallbladder    Restless leg syndrome  takes requip    SURGICAL HISTORY: Past Surgical History:  Procedure Laterality Date   BREAST RECONSTRUCTION WITH PLACEMENT OF TISSUE EXPANDER AND ALLODERM Bilateral 07/22/2021   Procedure: BILATERAL  BREAST RECONSTRUCTION WITH PLACEMENT OF TISSUE EXPANDER AND ALLODERM;  Surgeon: Irene Limbo, MD;  Location: [facility];  Service: Plastics;  Laterality: Bilateral;   COLONOSCOPY     LIPOSUCTION WITH LIPOFILLING Bilateral 10/17/2021   Procedure: LIPOFILLING FROM ABDOMEN TO BILATERAL CHEST;  Surgeon: Irene Limbo, MD;  Location: [facility];  Service: Plastics;  Laterality: Bilateral;   REMOVAL OF BILATERAL TISSUE EXPANDERS WITH PLACEMENT OF BILATERAL BREAST IMPLANTS Bilateral 10/17/2021   Procedure: REMOVAL OF BILATERAL TISSUE EXPANDERS WITH PLACEMENT OF BILATERAL BREAST SILICONE IMPLANTS;  Surgeon: Irene Limbo, MD;  Location: [facility];  Service: Plastics;  Laterality: Bilateral;   TOTAL MASTECTOMY Bilateral 07/22/2021   Procedure: BILATERAL TOTAL MASTECTOMY WITH LEFT SENTINEL LYMPH NODE BIOPSY;  Surgeon: Stark Klein, MD;  Location: [facility];  Service: General;  Laterality: Bilateral;    SOCIAL HISTORY: Social History   Socioeconomic History   Marital status: Married    Spouse name: Not on file   Number of children: Not on file   Years of education: Not on file   Highest education level: Not on file  Occupational History   Not on file  Tobacco Use   Smoking status: Never   Smokeless tobacco: Never  Substance and Sexual Activity   Alcohol use: Yes    Comment: 1-2 week, social   Drug use: Never   Sexual activity: Yes    Birth control/protection: I.U.D.  Other Topics Concern   Not on file  Social History Narrative   Not on file   Social Determinants of Health   Financial Resource Strain: Not on file  Food Insecurity: Not on file  Transportation Needs: Not on file  Physical Activity: Not on file  Stress: Not on file  Social Connections: Not on file  Intimate Partner Violence: Not on file    FAMILY HISTORY: Family History  Problem Relation Age of Onset   Bladder Cancer Paternal Uncle 55        non-smoker   Dementia Maternal Grandmother    Stroke Maternal Grandfather    Liver cancer Paternal Grandmother 1   Cancer Paternal Grandmother 61       gallbladder cancer    Review of Systems  Constitutional:  Negative for appetite change, chills, fatigue, fever and unexpected weight change.  HENT:   Negative for hearing loss, lump/mass and trouble swallowing.   Eyes:  Negative for eye problems and icterus.  Respiratory:  Negative for chest tightness, cough and shortness of breath.   Cardiovascular:  Negative for chest pain, leg swelling and palpitations.  Gastrointestinal:  Negative for abdominal distention, abdominal pain, constipation, diarrhea, nausea and vomiting.  Endocrine: Negative for hot flashes.  Genitourinary:  Negative for difficulty urinating.   Musculoskeletal:  Negative for arthralgias.  Skin:  Negative for itching and rash.  Neurological:  Negative for dizziness, extremity weakness, headaches and numbness.  Hematological:  Negative for adenopathy. Does not bruise/bleed easily.  Psychiatric/Behavioral:  Negative for depression. The patient is not nervous/anxious.      PHYSICAL EXAMINATION  ECOG PERFORMANCE STATUS: 0 - Asymptomatic  Vitals:   11/03/21 0849  BP: 134/71  Pulse: 67  Resp: 18  Temp: 97.9 F (36.6 C)  SpO2: 97%    Physical Exam Constitutional:      General: She  is not in acute distress.    Appearance: Normal appearance. She is not toxic-appearing.  HENT:     Head: Normocephalic and atraumatic.  Eyes:     General: No scleral icterus. Cardiovascular:     Rate and Rhythm: Normal rate and regular rhythm.     Pulses: Normal pulses.     Heart sounds: Normal heart sounds.  Pulmonary:     Effort: Pulmonary effort is normal.     Breath sounds: Normal breath sounds.  Chest:     Comments: Breasts are status post bilateral mastectomies with reconstruction.  They have healed well.  There is no sign of local recurrence. Abdominal:     General:  Abdomen is flat. Bowel sounds are normal. There is no distension.     Palpations: Abdomen is soft.     Tenderness: There is no abdominal tenderness.  Musculoskeletal:        General: No swelling.     Cervical back: Neck supple.  Lymphadenopathy:     Cervical: No cervical adenopathy.  Skin:    General: Skin is warm and dry.     Findings: No rash.  Neurological:     General: No focal deficit present.     Mental Status: She is alert.  Psychiatric:        Mood and Affect: Mood normal.        Behavior: Behavior normal.    LABORATORY DATA:  CBC    Component Value Date/Time   WBC 6.9 05/28/2021 1211   RBC 3.99 05/28/2021 1211   HGB 12.3 05/28/2021 1211   HCT 36.1 05/28/2021 1211   PLT 217 05/28/2021 1211   MCV 90.5 05/28/2021 1211   MCH 30.8 05/28/2021 1211   MCHC 34.1 05/28/2021 1211   RDW 12.2 05/28/2021 1211   LYMPHSABS 1.5 05/28/2021 1211   MONOABS 0.6 05/28/2021 1211   EOSABS 0.2 05/28/2021 1211   BASOSABS 0.0 05/28/2021 1211    CMP     Component Value Date/Time   NA 138 07/18/2021 0917   K 4.5 07/18/2021 0917   CL 103 07/18/2021 0917   CO2 27 07/18/2021 0917   GLUCOSE 96 07/18/2021 0917   BUN 15 07/18/2021 0917   CREATININE 0.59 07/18/2021 0917   CREATININE 0.71 05/28/2021 1211   CALCIUM 9.0 07/18/2021 0917   PROT 7.5 05/28/2021 1211   ALBUMIN 4.2 05/28/2021 1211   AST 16 05/28/2021 1211   ALT 12 05/28/2021 1211   ALKPHOS 95 05/28/2021 1211   BILITOT 0.4 05/28/2021 1211   GFRNONAA >60 07/18/2021 0917   GFRNONAA >60 05/28/2021 1211         ASSESSMENT and THERAPY PLAN:   Ductal carcinoma in situ (DCIS) of left breast 05/20/2021: Screening mammogram detected left breast calcifications UOQ: 1.8 cm, stereotactic biopsy revealed high-grade DCIS with necrosis and calcifications with focal suspicious microinvasion, ER 95%, PR 5%, axilla negative, breast MRI revealed 4.6 cm area of non-mass enhancement, multiple axillary lymph nodes at least 7.  07/22/2021:  Bilateral mastectomies:  Right mastectomy: Benign Left mastectomy: Focus of microinvasive ductal carcinoma 0.1 cm, grade 1, high-grade DCIS with necrosis, margins negative, LCIS, 0/5 lymph nodes negative, ER 95%, PR 95%, HER2 and Ki-67 not performed   The patient has completed what is considered curative therapy for her ductal carcinoma in situ with bilateral mastectomies.  She has not continued on any antiestrogen therapy.  We discussed survivorship and living her best life after cancer.  This includes healthy diet eating plenty of different  colors of fruits and vegetables.  This also includes exercise 150 to 300 minutes a week.    She wonders if there is anything going on with her well water.  She has not had her water tested.  I let her know that that would be an option if she is concerned about it however another option would be getting a filter for her water.  We discussed staying up-to-date with other cancer screenings such as colonoscopy, Pap smears, skin exam.  We reviewed that there is no role of routine imaging with mammogram or MRI to evaluate for recurrent breast cancer.  However based on the type of her implants she will follow-up with Dr. Iran Planas to discuss imaging for silent rupture surveillance.  She has follow-up with Dr. Iran Planas later this month.  I let the patient know that we are happy to see her at any point should she need [place] in the future.    All questions were answered. The patient knows to call the clinic with any problems, questions or concerns. We can certainly see the patient much sooner if necessary.  Total encounter time: 20 minutes in face-to-face visit time, chart review, lab review, care coordination, and documentation of the encounter.  Wilber Bihari, NP 11/03/21 1:16 PM Medical Oncology and Hematology [facility] [place], [place] [address] Tel. [phone]    Fax. [phone]  *Total Encounter Time as defined by the  Centers for Medicare and Medicaid Services includes, in addition to the face-to-face time of a patient visit (documented in the note above) non-face-to-face time: obtaining and reviewing outside history, ordering and reviewing medications, tests or procedures, care coordination (communications with other health care professionals or caregivers) and documentation in the medical record.

## 2021-11-03 ENCOUNTER — Encounter: Payer: Self-pay | Admitting: Adult Health

## 2021-11-03 ENCOUNTER — Inpatient Hospital Stay: Payer: BC Managed Care – PPO | Attending: Hematology and Oncology | Admitting: Adult Health

## 2021-11-03 ENCOUNTER — Other Ambulatory Visit: Payer: Self-pay

## 2021-11-03 VITALS — BP 134/71 | HR 67 | Temp 97.9°F | Resp 18 | Ht 67.0 in | Wt 170.7 lb

## 2021-11-03 DIAGNOSIS — Z808 Family history of malignant neoplasm of other organs or systems: Secondary | ICD-10-CM | POA: Insufficient documentation

## 2021-11-03 DIAGNOSIS — Z8052 Family history of malignant neoplasm of bladder: Secondary | ICD-10-CM | POA: Insufficient documentation

## 2021-11-03 DIAGNOSIS — D0512 Intraductal carcinoma in situ of left breast: Secondary | ICD-10-CM | POA: Diagnosis present

## 2022-08-14 ENCOUNTER — Other Ambulatory Visit: Payer: Self-pay | Admitting: Gastroenterology

## 2022-08-14 DIAGNOSIS — R1032 Left lower quadrant pain: Secondary | ICD-10-CM

## 2022-08-18 ENCOUNTER — Other Ambulatory Visit: Payer: BC Managed Care – PPO

## 2022-08-19 ENCOUNTER — Ambulatory Visit
Admission: RE | Admit: 2022-08-19 | Discharge: 2022-08-19 | Disposition: A | Discharge status: Home / Self Care | Payer: BC Managed Care – PPO | Source: Ambulatory Visit | Attending: Gastroenterology | Admitting: Gastroenterology

## 2022-08-19 DIAGNOSIS — R1032 Left lower quadrant pain: Secondary | ICD-10-CM

## 2022-12-30 ENCOUNTER — Other Ambulatory Visit (HOSPITAL_BASED_OUTPATIENT_CLINIC_OR_DEPARTMENT_OTHER): Payer: Self-pay

## 2022-12-31 ENCOUNTER — Other Ambulatory Visit (HOSPITAL_BASED_OUTPATIENT_CLINIC_OR_DEPARTMENT_OTHER): Payer: Self-pay

## 2022-12-31 MED ORDER — WEGOVY 0.25 MG/0.5ML ~~LOC~~ SOAJ
0.2500 mg | SUBCUTANEOUS | 0 refills | Status: AC
  Filled 2022-12-31: qty 2, 28d supply, fill #0

## 2023-01-07 ENCOUNTER — Other Ambulatory Visit (HOSPITAL_BASED_OUTPATIENT_CLINIC_OR_DEPARTMENT_OTHER): Payer: Self-pay

## 2023-01-07 ENCOUNTER — Encounter (HOSPITAL_BASED_OUTPATIENT_CLINIC_OR_DEPARTMENT_OTHER): Payer: Self-pay

## 2023-01-09 ENCOUNTER — Other Ambulatory Visit (HOSPITAL_BASED_OUTPATIENT_CLINIC_OR_DEPARTMENT_OTHER): Payer: Self-pay

## 2023-04-09 ENCOUNTER — Other Ambulatory Visit: Payer: Self-pay | Admitting: Family Medicine

## 2023-04-09 ENCOUNTER — Ambulatory Visit
Admission: RE | Admit: 2023-04-09 | Discharge: 2023-04-09 | Disposition: A | Discharge status: Home / Self Care | Payer: BC Managed Care – PPO | Source: Ambulatory Visit | Attending: Family Medicine | Admitting: Family Medicine

## 2023-04-09 DIAGNOSIS — M25552 Pain in left hip: Secondary | ICD-10-CM

## 2023-04-20 ENCOUNTER — Other Ambulatory Visit (HOSPITAL_COMMUNITY)
Admission: RE | Admit: 2023-04-20 | Discharge: 2023-04-20 | Disposition: A | Discharge status: Home / Self Care | Payer: BC Managed Care – PPO | Source: Ambulatory Visit | Attending: Nurse Practitioner | Admitting: Nurse Practitioner

## 2023-04-20 ENCOUNTER — Other Ambulatory Visit: Payer: Self-pay | Admitting: Nurse Practitioner

## 2023-04-20 DIAGNOSIS — Z124 Encounter for screening for malignant neoplasm of cervix: Secondary | ICD-10-CM | POA: Diagnosis present

## 2023-04-26 LAB — CYTOLOGY - PAP
Comment: NEGATIVE
Diagnosis: UNDETERMINED — AB
High risk HPV: NEGATIVE

## 2023-05-05 ENCOUNTER — Other Ambulatory Visit: Payer: Self-pay

## 2023-07-15 ENCOUNTER — Other Ambulatory Visit: Payer: Self-pay | Admitting: Family Medicine

## 2023-07-15 DIAGNOSIS — R197 Diarrhea, unspecified: Secondary | ICD-10-CM

## 2023-07-15 DIAGNOSIS — R109 Unspecified abdominal pain: Secondary | ICD-10-CM
